# Patient Record
Sex: Female | Born: 1955 | Race: White | Hispanic: No | Marital: Married | Smoking: Never smoker
Health system: Southern US, Community
[De-identification: ages and names within clinical notes are randomized; demographics above are authoritative.]

## PROBLEM LIST (undated history)

## (undated) DIAGNOSIS — I10 Essential (primary) hypertension: Secondary | ICD-10-CM

## (undated) DIAGNOSIS — E785 Hyperlipidemia, unspecified: Secondary | ICD-10-CM

## (undated) DIAGNOSIS — Z889 Allergy status to unspecified drugs, medicaments and biological substances status: Secondary | ICD-10-CM

## (undated) DIAGNOSIS — K219 Gastro-esophageal reflux disease without esophagitis: Secondary | ICD-10-CM

## (undated) DIAGNOSIS — M1990 Unspecified osteoarthritis, unspecified site: Secondary | ICD-10-CM

## (undated) DIAGNOSIS — F419 Anxiety disorder, unspecified: Secondary | ICD-10-CM

## (undated) HISTORY — DX: Anxiety disorder, unspecified: F41.9

## (undated) HISTORY — DX: Allergy status to unspecified drugs, medicaments and biological substances: Z88.9

## (undated) HISTORY — DX: Hyperlipidemia, unspecified: E78.5

## (undated) HISTORY — DX: Unspecified osteoarthritis, unspecified site: M19.90

## (undated) HISTORY — DX: Gastro-esophageal reflux disease without esophagitis: K21.9

## (undated) HISTORY — PX: KNEE ARTHROSCOPY: SUR90

## (undated) HISTORY — DX: Essential (primary) hypertension: I10

---

## 2001-04-12 ENCOUNTER — Other Ambulatory Visit: Admission: RE | Admit: 2001-04-12 | Discharge: 2001-04-12 | Payer: Self-pay | Admitting: Dermatology

## 2002-03-03 ENCOUNTER — Encounter: Admission: RE | Admit: 2002-03-03 | Discharge: 2002-03-03 | Payer: Self-pay | Admitting: General Surgery

## 2002-03-03 ENCOUNTER — Encounter: Payer: Self-pay | Admitting: General Surgery

## 2002-05-01 ENCOUNTER — Emergency Department (HOSPITAL_COMMUNITY): Admission: EM | Admit: 2002-05-01 | Discharge: 2002-05-01 | Payer: Self-pay | Admitting: Emergency Medicine

## 2003-06-19 ENCOUNTER — Encounter: Admission: RE | Admit: 2003-06-19 | Discharge: 2003-06-19 | Payer: Self-pay | Admitting: Unknown Physician Specialty

## 2004-03-31 HISTORY — PX: CARPAL TUNNEL RELEASE: SHX101

## 2004-05-27 ENCOUNTER — Encounter: Admission: RE | Admit: 2004-05-27 | Discharge: 2004-07-18 | Payer: Self-pay | Admitting: *Deleted

## 2004-08-14 ENCOUNTER — Ambulatory Visit (HOSPITAL_BASED_OUTPATIENT_CLINIC_OR_DEPARTMENT_OTHER): Admission: RE | Admit: 2004-08-14 | Discharge: 2004-08-14 | Payer: Self-pay | Admitting: Orthopedic Surgery

## 2005-05-14 ENCOUNTER — Encounter: Admission: RE | Admit: 2005-05-14 | Discharge: 2005-05-14 | Payer: Self-pay | Admitting: *Deleted

## 2005-05-23 ENCOUNTER — Encounter: Admission: RE | Admit: 2005-05-23 | Discharge: 2005-05-23 | Payer: Self-pay | Admitting: *Deleted

## 2007-05-20 IMAGING — MG MM MAMMO SCREENING
4 series · 4 of 4 positions shown · non-contrast
Comparison: none

SCREENING MAMMOGRAM:
There is a fibroglandular pattern.  A possible mass is noted in the left breast.  Spot compression 
views and possibly sonography are recommended for further evaluation.  In the right breast, no 
masses or malignant type calcifications are identified.  Compared with prior studies.

[R CC]
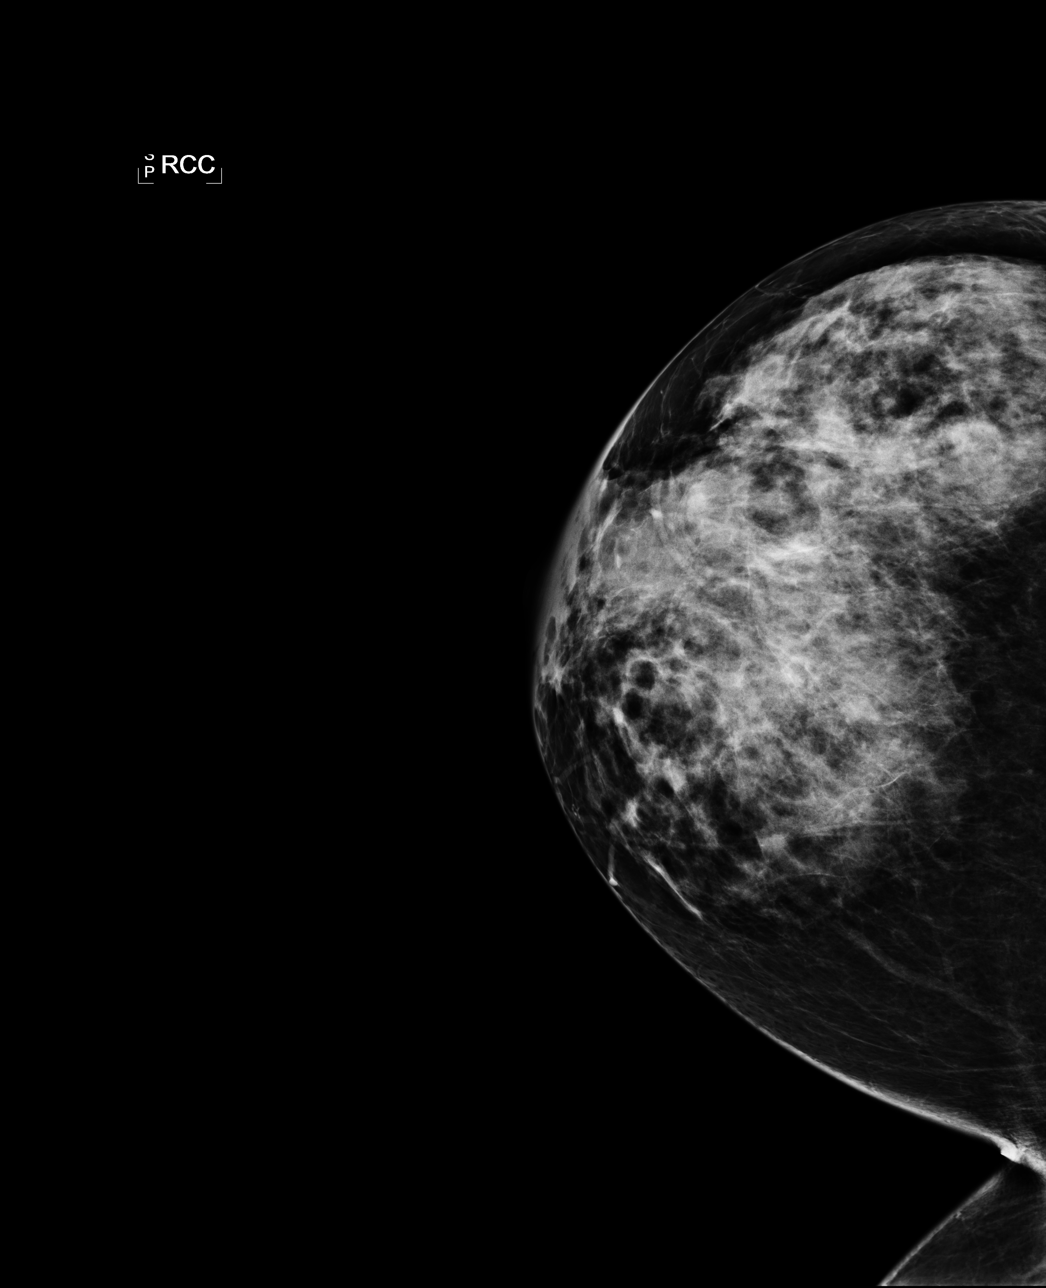

[L CC]
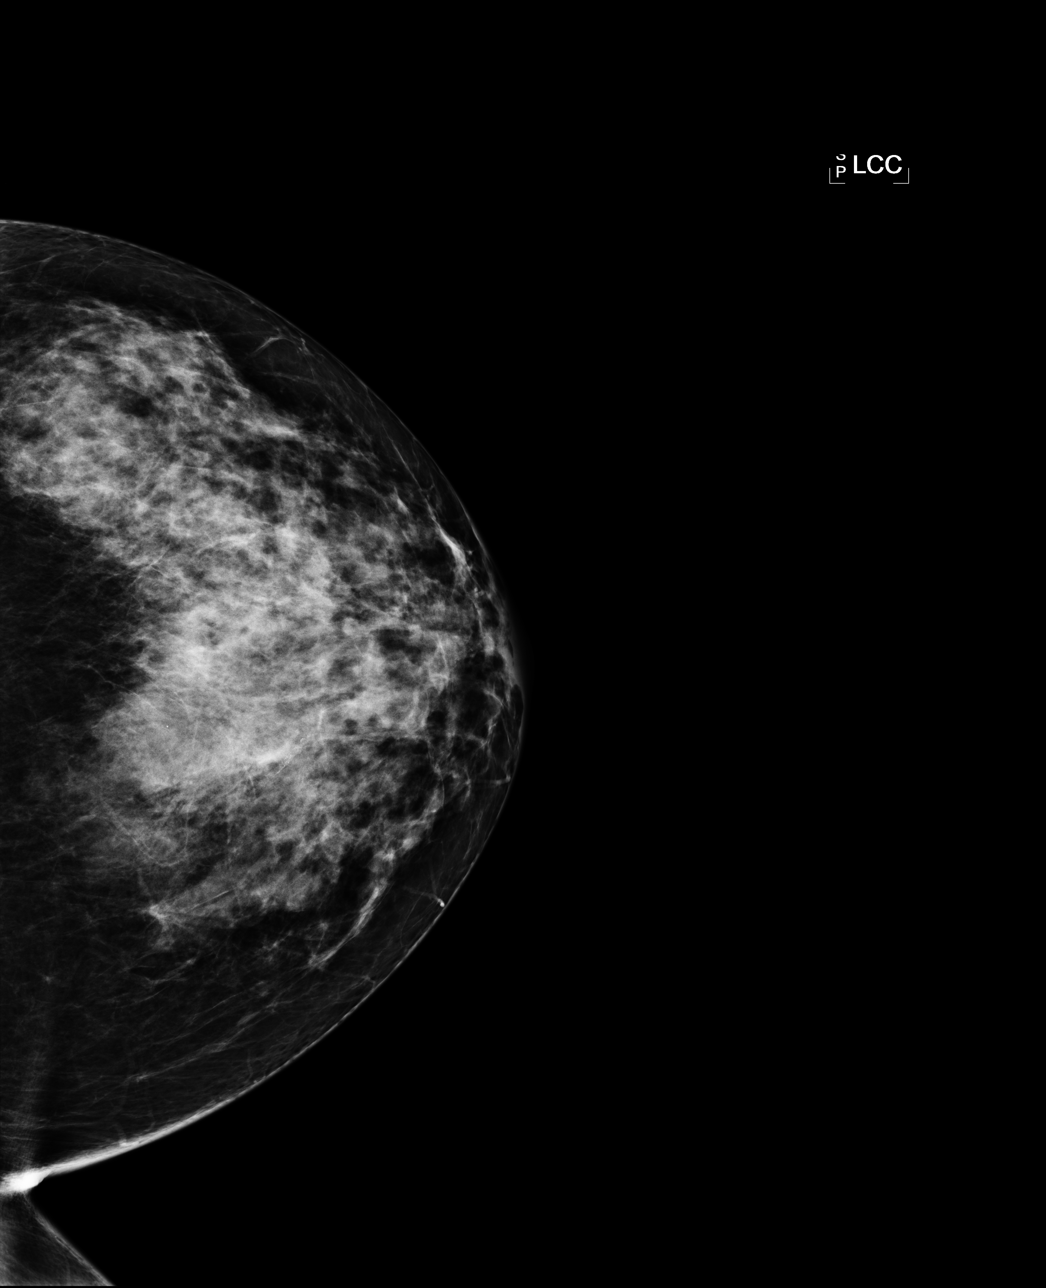

[L MLO]
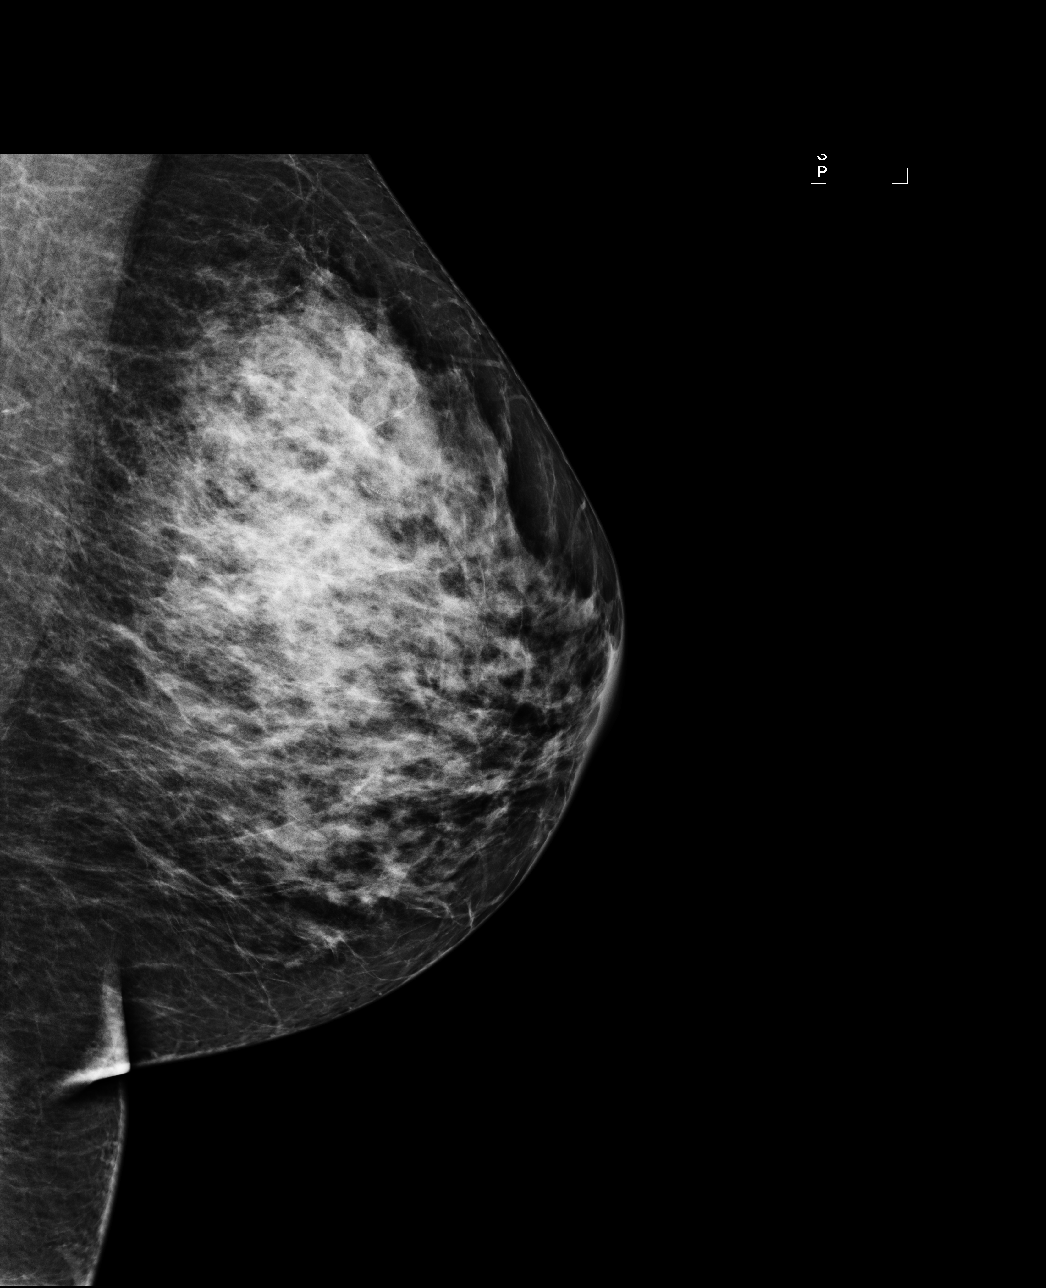

[R MLO]
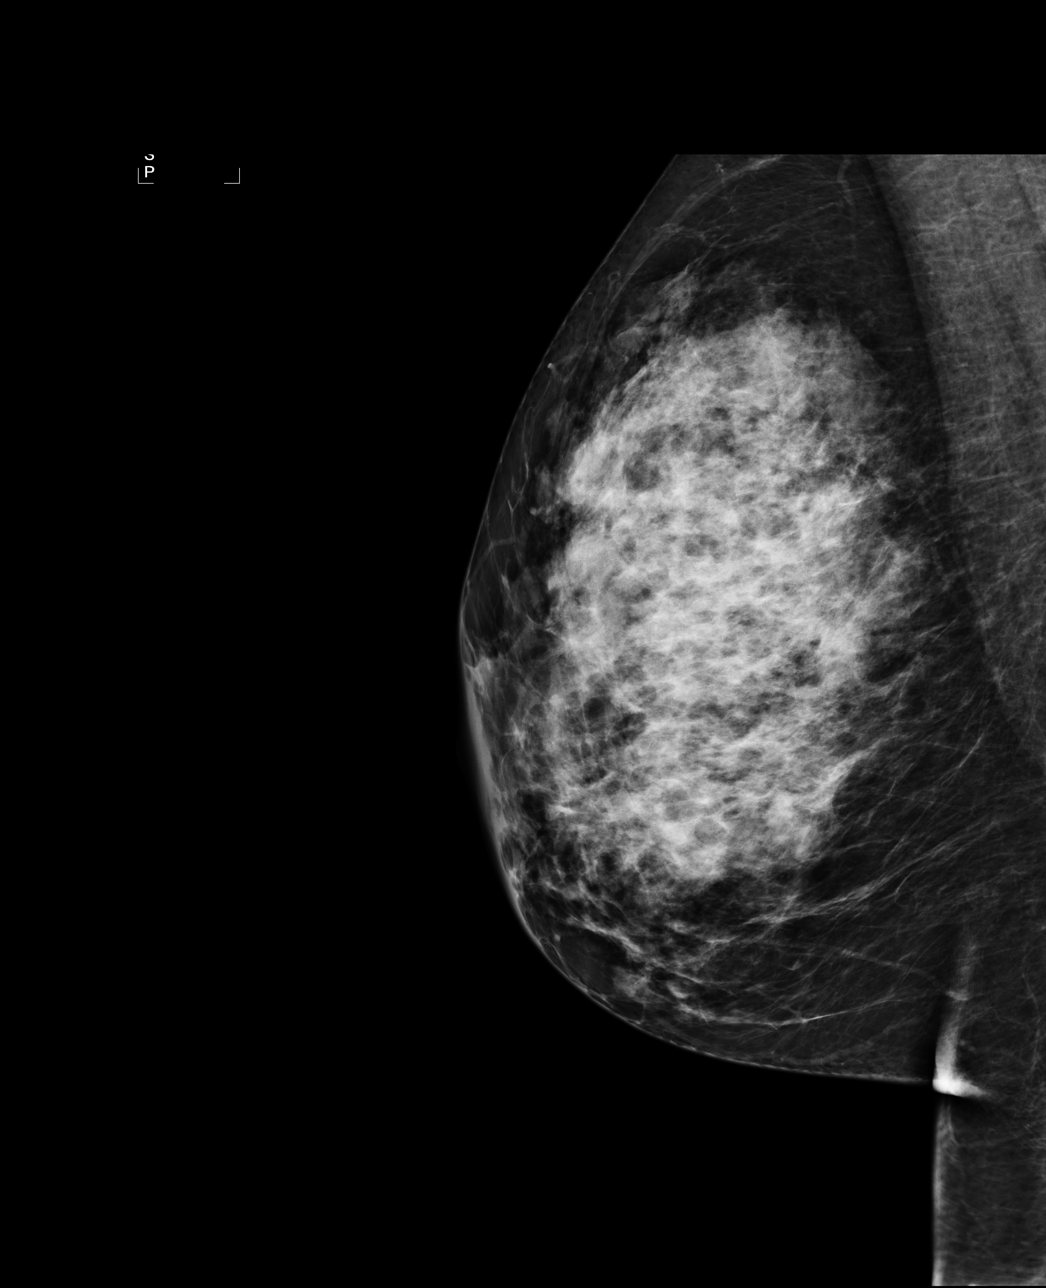

[4 of 4 positions shown; findings below may reference images not displayed]

IMPRESSION: Possible mass, left breast.  Additional evaluation is indicated.  The patient will be contacted for
additional studies and a supplementary report will follow.  No specific mammographic evidence of 
malignancy, right breast.

ASSESSMENT: Need additional imaging evaluation and/or prior mammograms for comparison - BI-RADS 0

Further imaging of the left breast.

## 2008-11-26 ENCOUNTER — Emergency Department (HOSPITAL_COMMUNITY): Admission: EM | Admit: 2008-11-26 | Discharge: 2008-11-26 | Payer: Self-pay | Admitting: Emergency Medicine

## 2008-12-07 ENCOUNTER — Ambulatory Visit (HOSPITAL_COMMUNITY): Admission: RE | Admit: 2008-12-07 | Discharge: 2008-12-07 | Payer: Self-pay | Admitting: Orthopedic Surgery

## 2008-12-20 ENCOUNTER — Encounter: Admission: RE | Admit: 2008-12-20 | Discharge: 2008-12-20 | Payer: Self-pay | Admitting: *Deleted

## 2008-12-21 ENCOUNTER — Encounter: Admission: RE | Admit: 2008-12-21 | Discharge: 2009-03-21 | Payer: Self-pay | Admitting: Orthopedic Surgery

## 2010-01-17 ENCOUNTER — Encounter: Admission: RE | Admit: 2010-01-17 | Discharge: 2010-01-17 | Payer: Self-pay | Admitting: *Deleted

## 2010-07-05 LAB — COMPREHENSIVE METABOLIC PANEL
Calcium: 9.7 mg/dL (ref 8.4–10.5)
GFR calc Af Amer: >60 mL/min (ref >60)
GFR calc non Af Amer: >60 mL/min (ref >60)
Glucose, Bld: 106 mg/dL — ABNORMAL HIGH (ref 70–99)
Potassium: 4.4 mEq/L (ref 3.5–5.1)
Sodium: 140 mEq/L (ref 135–145)
Total Bilirubin: 0.4 mg/dL (ref 0.3–1.2)
Total Protein: 7.5 g/dL (ref 6.0–8.3)

## 2010-07-05 LAB — CBC
HCT: 42 % (ref 36.0–46.0)
Hemoglobin: 14.4 g/dL (ref 12.0–15.0)
MCHC: 34.5 g/dL (ref 30.0–36.0)
MCV: 92 fL (ref 78.0–100.0)
Platelets: 179 10*3/uL (ref 150–400)
RDW: 12.5 % (ref 11.5–15.5)
WBC: 5.7 10*3/uL (ref 4.0–10.5)

## 2010-07-05 LAB — DIFFERENTIAL
Basophils Absolute: 0 10*3/uL (ref 0.0–0.1)
Basophils Relative: 0 % (ref 0–1)
Eosinophils Relative: 5 % (ref 0–5)
Monocytes Absolute: 0.4 10*3/uL (ref 0.1–1.0)
Neutro Abs: 4 10*3/uL (ref 1.7–7.7)
Neutrophils Relative %: 71 % (ref 43–77)

## 2010-08-16 NOTE — Op Note (Signed)
Kristen Arroyo, Kristen Arroyo             ACCOUNT NO.:  [id number]   MEDICAL RECORD NO.:  [id number]          PATIENT TYPE:  AMB   LOCATION:  DSC                          FACILITY:  [facility]   PHYSICIAN:  Cindee Salt, M.D.       DATE OF BIRTH:  October 23, 1955   DATE OF PROCEDURE:  08/14/2004  DATE OF DISCHARGE:                                 OPERATIVE REPORT   PREOPERATIVE DIAGNOSIS:  Carpal tunnel syndrome, right wrist; stenosing  tenosynovitis, right thumb.   POSTOPERATIVE DIAGNOSIS:  Carpal tunnel syndrome, right wrist; stenosing  tenosynovitis, right thumb.   OPERATION:  Release A1 pulley, right thumb and carpal tunnel release, right  hand.   SURGEON:  Cindee Salt, M.D.   ASSISTANTCarolyne Fiscal.   ANESTHESIA:  Forearm based IV regional.   HISTORY:  The patient is a 55 year old female with a history of carpal  tunnel syndrome EMG nerve conductions positive unresponsive to conservative  treatment. She also has triggering of her right thumb.   PROCEDURE:  The patient is brought to the operating room where a forearm  based IV regional anesthetic was carried out without difficulty. She was  prepped using DuraPrep, supine position, right arm free. A longitudinal  incision was made in the palm and carried down through subcutaneous tissue.  Bleeders were electrocauterized. Palmar fascia was split, superficial palmar  arch identified, the flexor tendon to the ring and little finger identified  to the ulnar side of median nerve, the carpal retinaculum was incised with  sharp dissection. A right angle and Sewell retractor were placed between  skin and forearm .  The forearm fascia was released for approximately a  centimeter and a half proximal to the wrist crease under direct vision.  Canal was explored. Tenosynovial tissue was moderately thickened. An area  compression to the nerve was apparent. The wound was irrigated. The skin was  closed with interrupted 5-0 nylon sutures. A transverse incision  was then  made over the metacarpophalangeal joint of the thumb, carried down through  subcutaneous tissue. Significant scarring about the A1 pulley with  thickening was identified. A significant nodule was present in the flexor  tendon.  A release was then performed to the A1 pulley on its radial aspect,  protecting the oblique pulley. The thumb placed through a full range motion,  no further triggering was identified, full mobility was present. The wound  was irrigated. The skin closed interrupted 5-0 nylon sutures. A sterile  compressive dressing and splint to the wrist and fingers applied. The  patient tolerated the procedure well and was taken to the recovery for  observation in satisfactory condition. She is discharged home to return to  the [facility] of [place] in one week on Vicodin.      GK/MEDQ  D:  08/14/2004  T:  08/14/2004  Job:  [id number]

## 2010-12-02 IMAGING — CR DG KNEE COMPLETE 4+V*L*
4 series · 4 of 4 positions shown · non-contrast
Comparison: None

CLINICAL DATA: Left knee injury with posterior pain.

LEFT KNEE - COMPLETE 4+ VIEW

[t knee ap left]
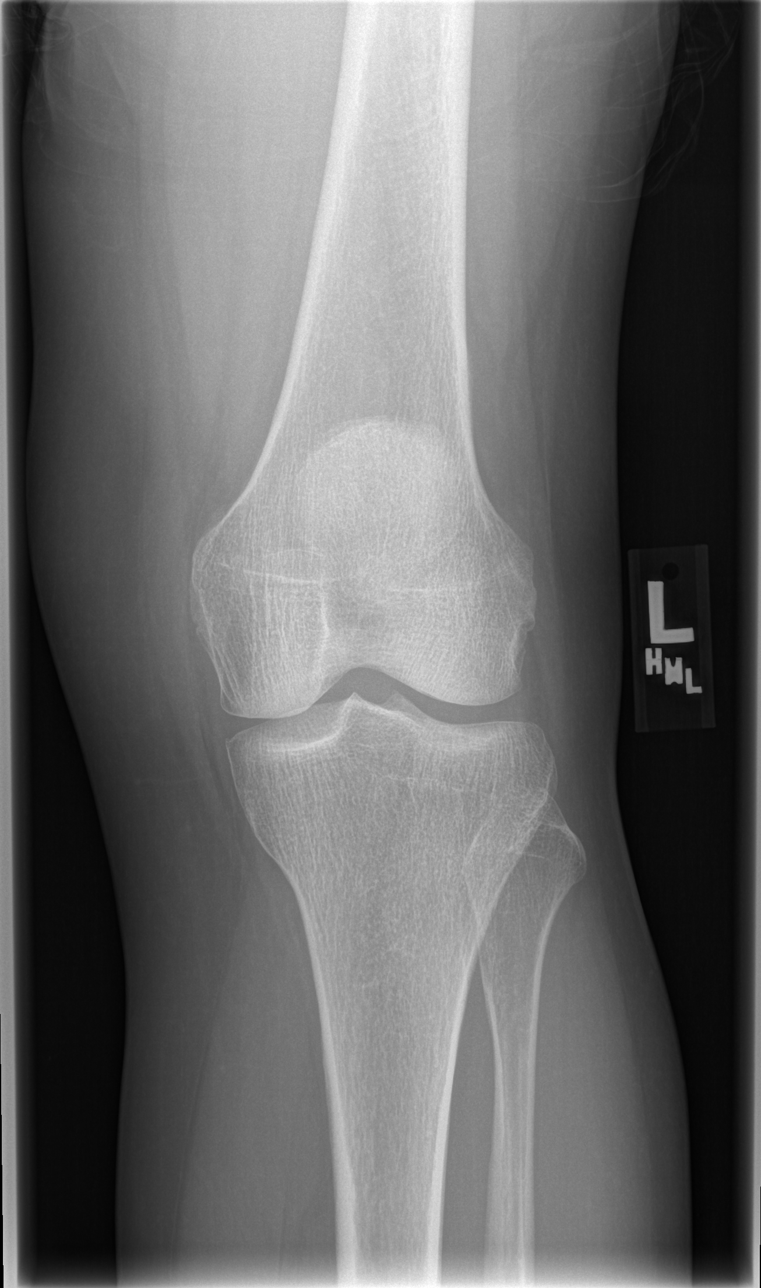

[t knee oblique left (1 of 2)]
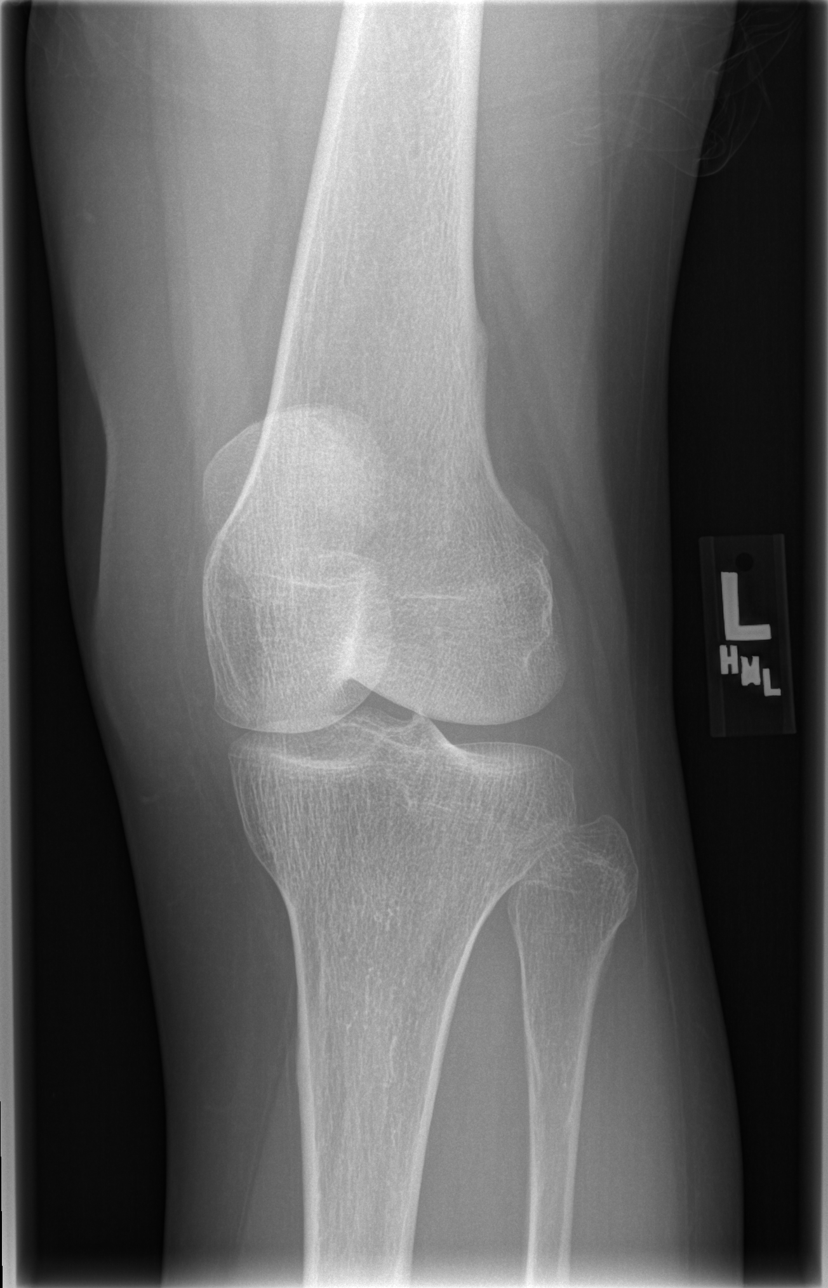

[t knee oblique left (2 of 2)]
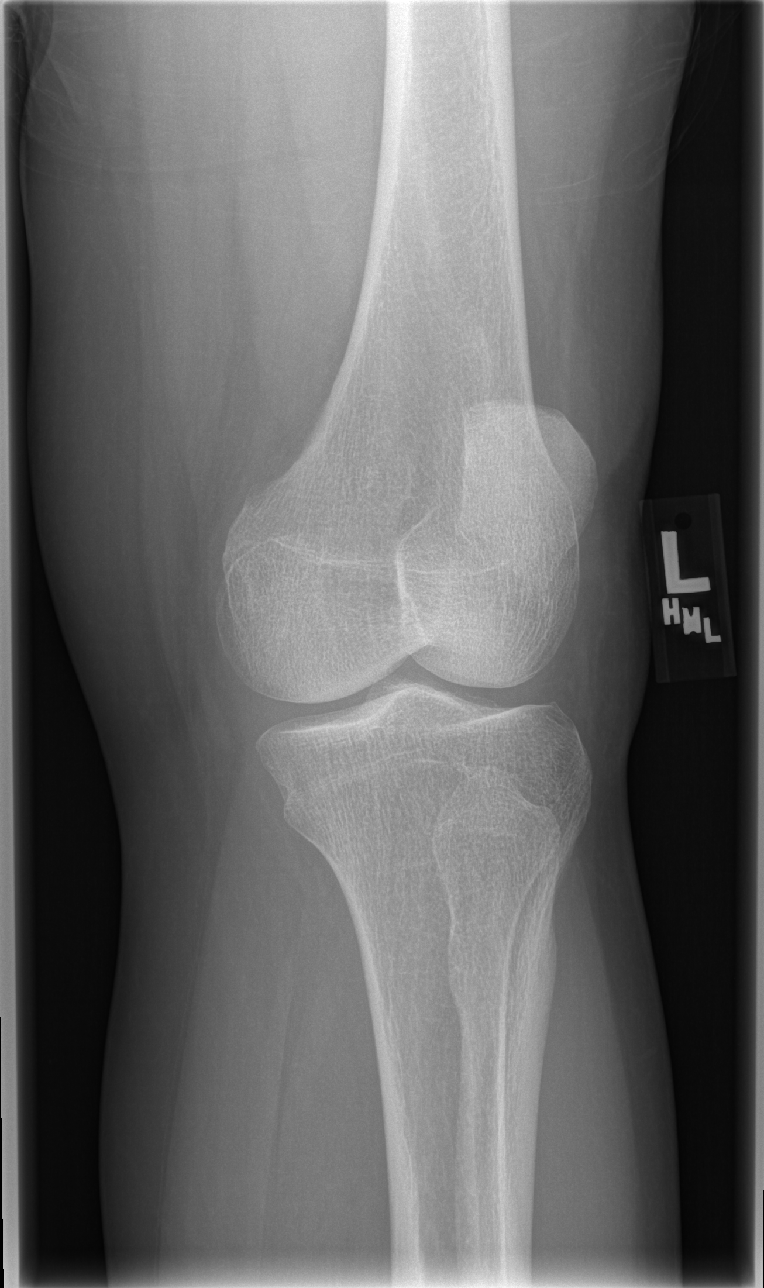

[t knee lat left]
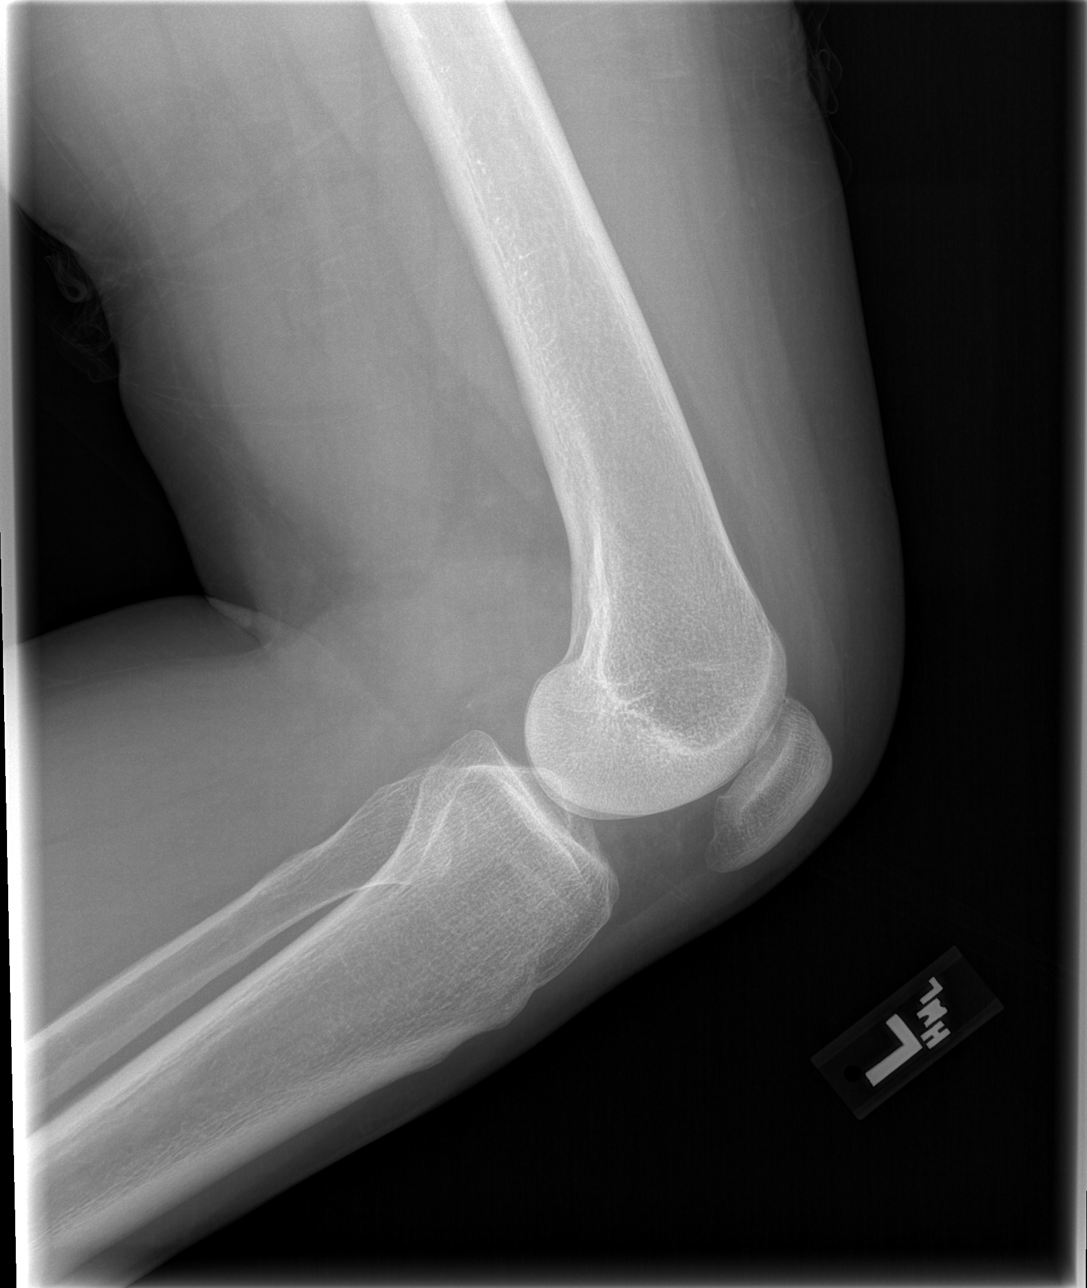

[4 of 4 positions shown; findings below may reference images not displayed]

FINDINGS: There is no evidence of fracture, dislocation, or joint
effusion.  There is no evidence of arthropathy or other focal bone
abnormality.  Soft tissues are unremarkable.
IMPRESSION: Negative.

## 2011-02-25 ENCOUNTER — Other Ambulatory Visit: Payer: Self-pay

## 2011-02-25 DIAGNOSIS — Z1231 Encounter for screening mammogram for malignant neoplasm of breast: Secondary | ICD-10-CM

## 2011-06-18 ENCOUNTER — Ambulatory Visit: Payer: Self-pay

## 2011-06-18 ENCOUNTER — Ambulatory Visit
Admission: RE | Admit: 2011-06-18 | Discharge: 2011-06-18 | Disposition: A | Discharge status: Home / Self Care | Payer: BC Managed Care – PPO | Source: Ambulatory Visit

## 2011-06-18 DIAGNOSIS — Z1231 Encounter for screening mammogram for malignant neoplasm of breast: Secondary | ICD-10-CM

## 2012-08-03 ENCOUNTER — Encounter: Payer: Self-pay | Admitting: Internal Medicine

## 2012-08-10 ENCOUNTER — Encounter: Payer: Self-pay | Admitting: Internal Medicine

## 2012-09-02 ENCOUNTER — Encounter: Payer: BC Managed Care – PPO | Admitting: Internal Medicine

## 2012-09-27 ENCOUNTER — Ambulatory Visit (AMBULATORY_SURGERY_CENTER): Payer: BC Managed Care – PPO | Admitting: *Deleted

## 2012-09-27 VITALS — Ht 66.0 in | Wt 167.2 lb

## 2012-09-27 DIAGNOSIS — Z1211 Encounter for screening for malignant neoplasm of colon: Secondary | ICD-10-CM

## 2012-09-27 MED ORDER — MOVIPREP 100 G PO SOLR: ORAL | Status: DC

## 2012-09-28 ENCOUNTER — Encounter: Payer: Self-pay | Admitting: Internal Medicine

## 2012-10-07 ENCOUNTER — Telehealth: Payer: Self-pay | Admitting: Internal Medicine

## 2012-10-07 NOTE — Telephone Encounter (Signed)
Pt notified okay to take Mobic day of procedure

## 2012-10-11 ENCOUNTER — Ambulatory Visit (AMBULATORY_SURGERY_CENTER): Payer: BC Managed Care – PPO | Admitting: Internal Medicine

## 2012-10-11 ENCOUNTER — Encounter: Payer: Self-pay | Admitting: Internal Medicine

## 2012-10-11 VITALS — BP 127/85 | HR 65 | Temp 96.9°F | Resp 11 | Ht 66.0 in | Wt 167.0 lb

## 2012-10-11 DIAGNOSIS — Z1211 Encounter for screening for malignant neoplasm of colon: Secondary | ICD-10-CM

## 2012-10-11 MED ORDER — SODIUM CHLORIDE 0.9 % IV SOLN: 500.0000 mL | INTRAVENOUS | Status: DC

## 2012-10-11 NOTE — Progress Notes (Signed)
Patient did not experience any of the following events: a burn prior to discharge; a fall within the facility; wrong site/side/patient/procedure/implant event; or a hospital transfer or hospital admission upon discharge from the facility. (G8907) Patient did not have preoperative order for IV antibiotic SSI prophylaxis. (G8918)  

## 2012-10-11 NOTE — Patient Instructions (Addendum)
YOU HAD AN ENDOSCOPIC PROCEDURE TODAY AT THE [facility]: Refer to the procedure report that was given to you for any specific questions about what was found during the examination.  If the procedure report does not answer your questions, please call your gastroenterologist to clarify.  If you requested that your care partner not be given the details of your procedure findings, then the procedure report has been included in a sealed envelope for you to review at your convenience later.  YOU SHOULD EXPECT: Some feelings of bloating in the abdomen. Passage of more gas than usual.  Walking can help get rid of the air that was put into your GI tract during the procedure and reduce the bloating. If you had a lower endoscopy (such as a colonoscopy or flexible sigmoidoscopy) you may notice spotting of blood in your stool or on the toilet paper. If you underwent a bowel prep for your procedure, then you may not have a normal bowel movement for a few days.  DIET: Your first meal following the procedure should be a light meal and then it is ok to progress to your normal diet.  A half-sandwich or bowl of soup is an example of a good first meal.  Heavy or fried foods are harder to digest and may make you feel nauseous or bloated.  Likewise meals heavy in dairy and vegetables can cause extra gas to form and this can also increase the bloating.  Drink plenty of fluids but you should avoid alcoholic beverages for 24 hours.  ACTIVITY: Your care partner should take you home directly after the procedure.  You should plan to take it easy, moving slowly for the rest of the day.  You can resume normal activity the day after the procedure however you should NOT DRIVE or use heavy machinery for 24 hours (because of the sedation medicines used during the test).    SYMPTOMS TO REPORT IMMEDIATELY: A gastroenterologist can be reached at any hour.  During normal business hours, 8:30 AM to 5:00 PM Monday through Friday,  call [phone].  After hours and on weekends, please call the GI answering service at [phone] who will take a message and have the physician on call contact you.   Following lower endoscopy (colonoscopy or flexible sigmoidoscopy):  Excessive amounts of blood in the stool  Significant tenderness or worsening of abdominal pains  Swelling of the abdomen that is new, acute  Fever of 100F or higher   FOLLOW UP:  Our staff will call the home number listed on your records the next business day following your procedure to check on you and address any questions or concerns that you may have at that time regarding the information given to you following your procedure. This is a courtesy call and so if there is no answer at the home number and we have not heard from you through the emergency physician on call, we will assume that you have returned to your regular daily activities without incident.  SIGNATURES/CONFIDENTIALITY: You and/or your care partner have signed paperwork which will be entered into your electronic medical record.  These signatures attest to the fact that that the information above on your After Visit Summary has been reviewed and is understood.  Full responsibility of the confidentiality of this discharge information lies with you and/or your care-partner.  Ok to continue your normal medications  Follow up colonoscopy in 10 years  Please read over handout about diverticulosis and high fiber diets

## 2012-10-11 NOTE — Progress Notes (Signed)
CRNA and doctor made aware of pt. B/P 173/102 left arm and 173/107 rt. Arm. Pt. Stated she did not take blood pressure medication. No new orders received.

## 2012-10-11 NOTE — Op Note (Signed)
[facility] [address] [place], [address]   COLONOSCOPY PROCEDURE REPORT  PATIENT: Kristen Arroyo, Kristen Arroyo  MR#: [id number] BIRTHDATE: 02-01-1956 , 57  yrs. old GENDER: Female ENDOSCOPIST: Roxy Cedar, MD REFERRED AO:ZHYQM Yetta Barre, PA-C PROCEDURE DATE:  10/11/2012 PROCEDURE:   Colonoscopy, screening First Screening Colonoscopy - Avg.  risk and is 50 yrs.  old or older Yes.  Prior Negative Screening - Now for repeat screening. N/A  History of Adenoma - Now for follow-up colonoscopy & has been > or = to 3 yrs.  N/A  Polyps Removed Today? No.  Recommend repeat exam, <10 yrs? No. ASA CLASS:   Class II INDICATIONS:average risk screening. MEDICATIONS: MAC sedation, administered by CRNA and propofol (Diprivan) 350mg  IV  DESCRIPTION OF PROCEDURE:   After the risks benefits and alternatives of the procedure were thoroughly explained, informed consent was obtained.  A digital rectal exam revealed no abnormalities of the rectum.   The LB [id number]  endoscope was introduced through the anus and advanced to the cecum, which was identified by both the appendix and ileocecal valve. No adverse events experienced.   The quality of the prep was excellent, using MoviPrep  The instrument was then slowly withdrawn as the colon was fully examined.     COLON FINDINGS: Mild diverticulosis was noted in the sigmoid colon. The colon was otherwise normal.  There was no  inflammation, polyps or cancers .  Retroflexed views revealed internal hemorrhoids. The time to cecum=2 minutes 40 seconds.  Withdrawal time=9 minutes 50 seconds.  The scope was withdrawn and the procedure completed.  COMPLICATIONS: There were no complications.  ENDOSCOPIC IMPRESSION: 1.   Mild diverticulosis was noted in the sigmoid colon 2.   The colon was otherwise normal  RECOMMENDATIONS: 1. Continue current colorectal screening recommendations for "routine risk" patients with a repeat colonoscopy  in 10 years.   eSigned:  Roxy Cedar, MD 10/11/2012 10:56 AM   cc: Prudy Feeler, [facility]   PATIENT NAME:  [name], [name] MR#: [id number]

## 2012-10-12 ENCOUNTER — Telehealth: Payer: Self-pay | Admitting: *Deleted

## 2012-10-12 NOTE — Telephone Encounter (Signed)
No identifier, left message, follow-up  

## 2012-12-15 ENCOUNTER — Encounter (INDEPENDENT_AMBULATORY_CARE_PROVIDER_SITE_OTHER): Payer: BC Managed Care – PPO | Admitting: Ophthalmology

## 2012-12-15 DIAGNOSIS — H35039 Hypertensive retinopathy, unspecified eye: Secondary | ICD-10-CM

## 2012-12-15 DIAGNOSIS — I10 Essential (primary) hypertension: Secondary | ICD-10-CM

## 2012-12-15 DIAGNOSIS — H2510 Age-related nuclear cataract, unspecified eye: Secondary | ICD-10-CM

## 2012-12-15 DIAGNOSIS — H43819 Vitreous degeneration, unspecified eye: Secondary | ICD-10-CM

## 2013-11-01 ENCOUNTER — Other Ambulatory Visit: Payer: Self-pay

## 2013-11-01 DIAGNOSIS — Z1231 Encounter for screening mammogram for malignant neoplasm of breast: Secondary | ICD-10-CM

## 2013-11-07 ENCOUNTER — Ambulatory Visit
Admission: RE | Admit: 2013-11-07 | Discharge: 2013-11-07 | Disposition: A | Discharge status: Home / Self Care | Payer: BC Managed Care – PPO | Source: Ambulatory Visit

## 2013-11-07 DIAGNOSIS — Z1231 Encounter for screening mammogram for malignant neoplasm of breast: Secondary | ICD-10-CM

## 2015-02-27 ENCOUNTER — Other Ambulatory Visit: Payer: Self-pay

## 2015-02-27 DIAGNOSIS — Z1231 Encounter for screening mammogram for malignant neoplasm of breast: Secondary | ICD-10-CM

## 2015-03-19 ENCOUNTER — Ambulatory Visit
Admission: RE | Admit: 2015-03-19 | Discharge: 2015-03-19 | Disposition: A | Discharge status: Home / Self Care | Payer: BLUE CROSS/BLUE SHIELD | Source: Ambulatory Visit

## 2015-03-19 DIAGNOSIS — Z1231 Encounter for screening mammogram for malignant neoplasm of breast: Secondary | ICD-10-CM

## 2016-01-10 ENCOUNTER — Other Ambulatory Visit: Payer: Self-pay

## 2016-01-10 MED ORDER — LISINOPRIL 5 MG PO TABS: 5.0000 mg | ORAL_TABLET | Freq: Every day | ORAL | 0 refills | Status: DC

## 2016-01-10 MED ORDER — HYDROCHLOROTHIAZIDE 25 MG PO TABS: 25.0000 mg | ORAL_TABLET | Freq: Every day | ORAL | 0 refills | Status: DC

## 2016-01-14 ENCOUNTER — Other Ambulatory Visit: Payer: Self-pay

## 2016-01-14 MED ORDER — LORATADINE 10 MG PO TABS: 10.0000 mg | ORAL_TABLET | Freq: Every day | ORAL | 0 refills | Status: DC

## 2016-02-27 ENCOUNTER — Other Ambulatory Visit: Payer: Self-pay | Admitting: Physician Assistant

## 2016-02-27 DIAGNOSIS — Z1231 Encounter for screening mammogram for malignant neoplasm of breast: Secondary | ICD-10-CM

## 2016-03-04 ENCOUNTER — Ambulatory Visit (INDEPENDENT_AMBULATORY_CARE_PROVIDER_SITE_OTHER): Payer: BLUE CROSS/BLUE SHIELD | Admitting: Physician Assistant

## 2016-03-04 ENCOUNTER — Encounter: Payer: Self-pay | Admitting: Physician Assistant

## 2016-03-04 VITALS — BP 129/80 | HR 73 | Temp 97.2°F | Ht 63.0 in | Wt 164.2 lb

## 2016-03-04 DIAGNOSIS — J0100 Acute maxillary sinusitis, unspecified: Secondary | ICD-10-CM

## 2016-03-04 DIAGNOSIS — I10 Essential (primary) hypertension: Secondary | ICD-10-CM

## 2016-03-04 DIAGNOSIS — M25549 Pain in joints of unspecified hand: Secondary | ICD-10-CM

## 2016-03-04 DIAGNOSIS — F3342 Major depressive disorder, recurrent, in full remission: Secondary | ICD-10-CM

## 2016-03-04 DIAGNOSIS — Z01419 Encounter for gynecological examination (general) (routine) without abnormal findings: Secondary | ICD-10-CM | POA: Diagnosis not present

## 2016-03-04 DIAGNOSIS — E782 Mixed hyperlipidemia: Secondary | ICD-10-CM

## 2016-03-04 DIAGNOSIS — Z0000 Encounter for general adult medical examination without abnormal findings: Secondary | ICD-10-CM

## 2016-03-04 DIAGNOSIS — K219 Gastro-esophageal reflux disease without esophagitis: Secondary | ICD-10-CM

## 2016-03-04 DIAGNOSIS — J3089 Other allergic rhinitis: Secondary | ICD-10-CM

## 2016-03-04 MED ORDER — LORATADINE 10 MG PO TABS: 10.0000 mg | ORAL_TABLET | Freq: Every day | ORAL | 0 refills | Status: DC

## 2016-03-04 MED ORDER — DICLOFENAC SODIUM 75 MG PO TBEC: 75.0000 mg | DELAYED_RELEASE_TABLET | Freq: Two times a day (BID) | ORAL | 3 refills | Status: DC

## 2016-03-04 MED ORDER — AMOXICILLIN 500 MG PO CAPS: 1000.0000 mg | ORAL_CAPSULE | Freq: Two times a day (BID) | ORAL | 1 refills | Status: DC

## 2016-03-04 MED ORDER — HYDROCHLOROTHIAZIDE 25 MG PO TABS: 25.0000 mg | ORAL_TABLET | Freq: Every day | ORAL | 3 refills | Status: DC

## 2016-03-04 MED ORDER — BUDESONIDE 32 MCG/ACT NA SUSP: 1.0000 | Freq: Two times a day (BID) | NASAL | 11 refills | Status: DC

## 2016-03-04 MED ORDER — ALPRAZOLAM 0.5 MG PO TABS: 0.5000 mg | ORAL_TABLET | Freq: Every evening | ORAL | 5 refills | Status: DC | PRN

## 2016-03-04 MED ORDER — LISINOPRIL 5 MG PO TABS: 5.0000 mg | ORAL_TABLET | Freq: Every day | ORAL | 0 refills | Status: DC

## 2016-03-04 MED ORDER — OMEPRAZOLE 20 MG PO CPDR: 20.0000 mg | DELAYED_RELEASE_CAPSULE | Freq: Every day | ORAL | 3 refills | Status: DC

## 2016-03-04 MED ORDER — SIMVASTATIN 40 MG PO TABS: 40.0000 mg | ORAL_TABLET | Freq: Every evening | ORAL | 3 refills | Status: DC

## 2016-03-04 MED ORDER — BUPROPION HCL ER (SR) 150 MG PO TB12: 150.0000 mg | ORAL_TABLET | Freq: Every day | ORAL | 3 refills | Status: DC

## 2016-03-04 NOTE — Patient Instructions (Signed)
DASH Eating Plan DASH stands for "Dietary Approaches to Stop Hypertension." The DASH eating plan is a healthy eating plan that has been shown to reduce high blood pressure (hypertension). Additional health benefits may include reducing the risk of type 2 diabetes mellitus, heart disease, and stroke. The DASH eating plan may also help with weight loss. What do I need to know about the DASH eating plan? For the DASH eating plan, you will follow these general guidelines:  Choose foods with less than 150 milligrams of sodium per serving (as listed on the food label).  Use salt-free seasonings or herbs instead of table salt or sea salt.  Check with your health care provider or pharmacist before using salt substitutes.  Eat lower-sodium products. These are often labeled as "low-sodium" or "no salt added."  Eat fresh foods. Avoid eating a lot of canned foods.  Eat more vegetables, fruits, and low-fat dairy products.  Choose whole grains. Look for the word "whole" as the first word in the ingredient list.  Choose fish and skinless chicken or turkey more often than red meat. Limit fish, poultry, and meat to 6 oz (170 g) each day.  Limit sweets, desserts, sugars, and sugary drinks.  Choose heart-healthy fats.  Eat more home-cooked food and less restaurant, buffet, and fast food.  Limit fried foods.  Do not fry foods. Cook foods using methods such as baking, boiling, grilling, and broiling instead.  When eating at a restaurant, ask that your food be prepared with less salt, or no salt if possible. What foods can I eat? Seek help from a dietitian for individual calorie needs. Grains  Whole grain or whole wheat bread. Brown rice. Whole grain or whole wheat pasta. Quinoa, bulgur, and whole grain cereals. Low-sodium cereals. Corn or whole wheat flour tortillas. Whole grain cornbread. Whole grain crackers. Low-sodium crackers. Vegetables  Fresh or frozen vegetables (raw, steamed, roasted, or  grilled). Low-sodium or reduced-sodium tomato and vegetable juices. Low-sodium or reduced-sodium tomato sauce and paste. Low-sodium or reduced-sodium canned vegetables. Fruits  All fresh, canned (in natural juice), or frozen fruits. Meat and Other Protein Products  Ground beef (85% or leaner), grass-fed beef, or beef trimmed of fat. Skinless chicken or turkey. Ground chicken or turkey. Pork trimmed of fat. All fish and seafood. Eggs. Dried beans, peas, or lentils. Unsalted nuts and seeds. Unsalted canned beans. Dairy  Low-fat dairy products, such as skim or 1% milk, 2% or reduced-fat cheeses, low-fat ricotta or cottage cheese, or plain low-fat yogurt. Low-sodium or reduced-sodium cheeses. Fats and Oils  Tub margarines without trans fats. Light or reduced-fat mayonnaise and salad dressings (reduced sodium). Avocado. Safflower, olive, or canola oils. Natural peanut or almond butter. Other  Unsalted popcorn and pretzels. The items listed above may not be a complete list of recommended foods or beverages. Contact your dietitian for more options.  What foods are not recommended? Grains  White bread. White pasta. White rice. Refined cornbread. Bagels and croissants. Crackers that contain trans fat. Vegetables  Creamed or fried vegetables. Vegetables in a cheese sauce. Regular canned vegetables. Regular canned tomato sauce and paste. Regular tomato and vegetable juices. Fruits  Canned fruit in light or heavy syrup. Fruit juice. Meat and Other Protein Products  Fatty cuts of meat. Ribs, chicken wings, bacon, sausage, bologna, salami, chitterlings, fatback, hot dogs, bratwurst, and packaged luncheon meats. Salted nuts and seeds. Canned beans with salt. Dairy  Whole or 2% milk, cream, half-and-half, and cream cheese. Whole-fat or sweetened yogurt. Full-fat cheeses   or blue cheese. Nondairy creamers and whipped toppings. Processed cheese, cheese spreads, or cheese curds. Condiments  Onion and garlic  salt, seasoned salt, table salt, and sea salt. Canned and packaged gravies. Worcestershire sauce. Tartar sauce. Barbecue sauce. Teriyaki sauce. Soy sauce, including reduced sodium. Steak sauce. Fish sauce. Oyster sauce. Cocktail sauce. Horseradish. Ketchup and mustard. Meat flavorings and tenderizers. Bouillon cubes. Hot sauce. Tabasco sauce. Marinades. Taco seasonings. Relishes. Fats and Oils  Butter, stick margarine, lard, shortening, ghee, and bacon fat. Coconut, palm kernel, or palm oils. Regular salad dressings. Other  Pickles and olives. Salted popcorn and pretzels. The items listed above may not be a complete list of foods and beverages to avoid. Contact your dietitian for more information.  Where can I find more information? National Heart, Lung, and Blood Institute: www.nhlbi.nih.gov/health/health-topics/topics/dash/ This information is not intended to replace advice given to you by your health care provider. Make sure you discuss any questions you have with your health care provider. Document Released: 03/06/2011 Document Revised: 08/23/2015 Document Reviewed: 01/19/2013 Elsevier Interactive Patient Education  2017 [employer]  

## 2016-03-05 LAB — CMP14+EGFR
ALBUMIN: 4.7 g/dL (ref 3.5–5.5)
ALK PHOS: 84 IU/L (ref 39–117)
ALT: 19 IU/L (ref 0–32)
AST: 18 IU/L (ref 0–40)
Albumin/Globulin Ratio: 1.7 (ref 1.2–2.2)
BILIRUBIN TOTAL: 0.4 mg/dL (ref 0.0–1.2)
BUN / CREAT RATIO: 27 — AB (ref 9–23)
BUN: 17 mg/dL (ref 6–24)
CHLORIDE: 99 mmol/L (ref 96–106)
CO2: 28 mmol/L (ref 18–29)
CREATININE: 0.63 mg/dL (ref 0.57–1.00)
Calcium: 9.5 mg/dL (ref 8.7–10.2)
GFR calc Af Amer: 114 mL/min/{1.73_m2} (ref >59)
GFR calc non Af Amer: 98 mL/min/{1.73_m2} (ref >59)
GLOBULIN, TOTAL: 2.7 g/dL (ref 1.5–4.5)
GLUCOSE: 76 mg/dL (ref 65–99)
Potassium: 4.3 mmol/L (ref 3.5–5.2)
SODIUM: 142 mmol/L (ref 134–144)
Total Protein: 7.4 g/dL (ref 6.0–8.5)

## 2016-03-05 LAB — CBC WITH DIFFERENTIAL/PLATELET
BASOS ABS: 0 10*3/uL (ref 0.0–0.2)
Basos: 0 %
EOS (ABSOLUTE): 0.2 10*3/uL (ref 0.0–0.4)
Eos: 4 %
Hematocrit: 41.9 % (ref 34.0–46.6)
Hemoglobin: 14.4 g/dL (ref 11.1–15.9)
Immature Grans (Abs): 0 10*3/uL (ref 0.0–0.1)
Immature Granulocytes: 0 %
LYMPHS ABS: 1.3 10*3/uL (ref 0.7–3.1)
Lymphs: 23 %
MCH: 31.6 pg (ref 26.6–33.0)
MCHC: 34.4 g/dL (ref 31.5–35.7)
MCV: 92 fL (ref 79–97)
MONOS ABS: 0.5 10*3/uL (ref 0.1–0.9)
Monocytes: 8 %
NEUTROS ABS: 3.6 10*3/uL (ref 1.4–7.0)
Neutrophils: 65 %
Platelets: 238 10*3/uL (ref 150–379)
RBC: 4.56 x10E6/uL (ref 3.77–5.28)
RDW: 12.9 % (ref 12.3–15.4)
WBC: 5.5 10*3/uL (ref 3.4–10.8)

## 2016-03-05 LAB — LIPID PANEL
CHOLESTEROL TOTAL: 203 mg/dL — AB (ref 100–199)
Chol/HDL Ratio: 4.1 ratio units (ref 0.0–4.4)
HDL: 50 mg/dL (ref >39)
LDL CALC: 122 mg/dL — AB (ref 0–99)
TRIGLYCERIDES: 154 mg/dL — AB (ref 0–149)
VLDL Cholesterol Cal: 31 mg/dL (ref 5–40)

## 2016-03-06 DIAGNOSIS — F3342 Major depressive disorder, recurrent, in full remission: Secondary | ICD-10-CM | POA: Insufficient documentation

## 2016-03-06 DIAGNOSIS — Z01419 Encounter for gynecological examination (general) (routine) without abnormal findings: Secondary | ICD-10-CM | POA: Insufficient documentation

## 2016-03-06 DIAGNOSIS — M25549 Pain in joints of unspecified hand: Secondary | ICD-10-CM | POA: Insufficient documentation

## 2016-03-06 DIAGNOSIS — J0100 Acute maxillary sinusitis, unspecified: Secondary | ICD-10-CM | POA: Insufficient documentation

## 2016-03-06 DIAGNOSIS — J3089 Other allergic rhinitis: Secondary | ICD-10-CM | POA: Insufficient documentation

## 2016-03-06 DIAGNOSIS — K219 Gastro-esophageal reflux disease without esophagitis: Secondary | ICD-10-CM | POA: Insufficient documentation

## 2016-03-06 DIAGNOSIS — E782 Mixed hyperlipidemia: Secondary | ICD-10-CM | POA: Insufficient documentation

## 2016-03-06 DIAGNOSIS — I10 Essential (primary) hypertension: Secondary | ICD-10-CM | POA: Insufficient documentation

## 2016-03-06 NOTE — Progress Notes (Signed)
BP 129/80   Pulse 73   Temp 97.2 F (36.2 C) (Oral)   Ht '5\' 3"'  (1.6 m)   Wt 164 lb 3.2 oz (74.5 kg)   BMI 29.09 kg/m    Subjective:    Patient ID: Kristen Arroyo, female    DOB: January 11, 1956, 60 y.o.   MRN: [id number]  Kristen Arroyo is a 60 y.o. female presenting on 03/04/2016 for Annual Exam  HPI Patient here to be established as new patient at [place].  This patient is known to me from [facility]. This patient comes in for Annual female exam and recheck on medications and conditions. All medications are reviewed today. There are no reports of any problems with the medications. All of the medical conditions are reviewed and updated.  Lab work is reviewed and will be ordered as medically necessary. There are no new problems reported with today's visit.   Past Medical History:  Diagnosis Date  . Anxiety   . Arthritis    knees  . GERD (gastroesophageal reflux disease)   . History of seasonal allergies   . Hyperlipidemia   . Hypertension    Relevant past medical, surgical, family and social history reviewed and updated as indicated. Interim medical history since our last visit reviewed. Allergies and medications reviewed and updated.   Data reviewed from any sources in EPIC.  Review of Systems  Constitutional: Negative.  Negative for activity change, fatigue and fever.  HENT: Negative.   Eyes: Negative.   Respiratory: Negative.  Negative for cough.   Cardiovascular: Negative.  Negative for chest pain.  Gastrointestinal: Negative.  Negative for abdominal pain.  Endocrine: Negative.   Genitourinary: Negative.  Negative for dysuria.  Musculoskeletal: Positive for arthralgias, joint swelling and myalgias.  Skin: Negative.   Neurological: Negative.      Social History   Social History  . Marital status: Married    Spouse name: N/A  . Number of children: N/A  . Years of education: N/A   Occupational History  . Not on file.     Social History Main Topics  . Smoking status: Never Smoker  . Smokeless tobacco: Never Used  . Alcohol use No  . Drug use: No  . Sexual activity: Not on file   Other Topics Concern  . Not on file   Social History Narrative  . No narrative on file    Past Surgical History:  Procedure Laterality Date  . CARPAL TUNNEL RELEASE  2006   right  . KNEE ARTHROSCOPY     left: torn meniscus    Family History  Problem Relation Age of Onset  . Colon cancer Neg Hx       Medication List       Accurate as of 03/04/16 11:59 PM. Always use your most recent med list.          ALPRAZolam 0.5 MG tablet Commonly known as:  XANAX Take 1 tablet (0.5 mg total) by mouth at bedtime as needed for sleep.   amoxicillin 500 MG capsule Commonly known as:  AMOXIL Take 2 capsules (1,000 mg total) by mouth 2 (two) times daily.   BENADRYL ALLERGY PO Take by mouth as needed.   budesonide 32 MCG/ACT nasal spray Commonly known as:  RHINOCORT AQUA Place 1 spray into both nostrils 2 (two) times daily.   buPROPion 150 MG 12 hr tablet Commonly known as:  WELLBUTRIN SR Take 1 tablet (150 mg total) by mouth daily.  diclofenac 75 MG EC tablet Commonly known as:  VOLTAREN Take 1 tablet (75 mg total) by mouth 2 (two) times daily.   hydrochlorothiazide 25 MG tablet Commonly known as:  HYDRODIURIL Take 1 tablet (25 mg total) by mouth daily.   lisinopril 5 MG tablet Commonly known as:  PRINIVIL,ZESTRIL Take 1 tablet (5 mg total) by mouth daily.   loratadine 10 MG tablet Commonly known as:  CLARITIN Take 1 tablet (10 mg total) by mouth daily.   multivitamin tablet Take 1 tablet by mouth daily.   omeprazole 20 MG capsule Commonly known as:  PRILOSEC Take 1 capsule (20 mg total) by mouth daily.   simvastatin 40 MG tablet Commonly known as:  ZOCOR Take 1 tablet (40 mg total) by mouth every evening.          Objective:    BP 129/80   Pulse 73   Temp 97.2 F (36.2 C) (Oral)    Ht '5\' 3"'  (1.6 m)   Wt 164 lb 3.2 oz (74.5 kg)   BMI 29.09 kg/m   No Known Allergies Wt Readings from Last 3 Encounters:  03/04/16 164 lb 3.2 oz (74.5 kg)  10/11/12 167 lb (75.8 kg)  09/27/12 167 lb 3.2 oz (75.8 kg)    Physical Exam  Constitutional: She is oriented to person, place, and time. She appears well-developed and well-nourished.  HENT:  Head: Normocephalic and atraumatic.  Eyes: Conjunctivae and EOM are normal. Pupils are equal, round, and reactive to light.  Neck: Normal range of motion. Neck supple.  Cardiovascular: Normal rate, regular rhythm, normal heart sounds and intact distal pulses.   Pulmonary/Chest: Effort normal and breath sounds normal. Right breast exhibits no mass, no skin change and no tenderness. Left breast exhibits no mass, no skin change and no tenderness. Breasts are symmetrical.  Abdominal: Soft. Bowel sounds are normal.  Genitourinary: Vagina normal and uterus normal. Rectal exam shows no fissure. No breast swelling, tenderness, discharge or bleeding. There is no tenderness or lesion on the right labia. There is no tenderness or lesion on the left labia. Uterus is not deviated, not enlarged and not tender. Cervix exhibits no motion tenderness, no discharge and no friability. Right adnexum displays no mass, no tenderness and no fullness. Left adnexum displays no mass, no tenderness and no fullness. No tenderness or bleeding in the vagina. No vaginal discharge found.  Neurological: She is alert and oriented to person, place, and time. She has normal reflexes.  Skin: Skin is warm and dry. No rash noted.  Psychiatric: She has a normal mood and affect. Her behavior is normal. Judgment and thought content normal.    Results for orders placed or performed in visit on 03/04/16  CBC with Differential/Platelet  Result Value Ref Range   WBC 5.5 3.4 - 10.8 x10E3/uL   RBC 4.56 3.77 - 5.28 x10E6/uL   Hemoglobin 14.4 11.1 - 15.9 g/dL   Hematocrit 41.9 34.0 - 46.6 %    MCV 92 79 - 97 fL   MCH 31.6 26.6 - 33.0 pg   MCHC 34.4 31.5 - 35.7 g/dL   RDW 12.9 12.3 - 15.4 %   Platelets 238 150 - 379 x10E3/uL   Neutrophils 65 Not Estab. %   Lymphs 23 Not Estab. %   Monocytes 8 Not Estab. %   Eos 4 Not Estab. %   Basos 0 Not Estab. %   Neutrophils Absolute 3.6 1.4 - 7.0 x10E3/uL   Lymphocytes Absolute 1.3 0.7 - 3.1 x10E3/uL  Monocytes Absolute 0.5 0.1 - 0.9 x10E3/uL   EOS (ABSOLUTE) 0.2 0.0 - 0.4 x10E3/uL   Basophils Absolute 0.0 0.0 - 0.2 x10E3/uL   Immature Granulocytes 0 Not Estab. %   Immature Grans (Abs) 0.0 0.0 - 0.1 x10E3/uL  CMP14+EGFR  Result Value Ref Range   Glucose 76 65 - 99 mg/dL   BUN 17 6 - 24 mg/dL   Creatinine, Ser 0.63 0.57 - 1.00 mg/dL   GFR calc non Af Amer 98 >59 mL/min/1.73   GFR calc Af Amer 114 >59 mL/min/1.73   BUN/Creatinine Ratio 27 (H) 9 - 23   Sodium 142 134 - 144 mmol/L   Potassium 4.3 3.5 - 5.2 mmol/L   Chloride 99 96 - 106 mmol/L   CO2 28 18 - 29 mmol/L   Calcium 9.5 8.7 - 10.2 mg/dL   Total Protein 7.4 6.0 - 8.5 g/dL   Albumin 4.7 3.5 - 5.5 g/dL   Globulin, Total 2.7 1.5 - 4.5 g/dL   Albumin/Globulin Ratio 1.7 1.2 - 2.2   Bilirubin Total 0.4 0.0 - 1.2 mg/dL   Alkaline Phosphatase 84 39 - 117 IU/L   AST 18 0 - 40 IU/L   ALT 19 0 - 32 IU/L  Lipid panel  Result Value Ref Range   Cholesterol, Total 203 (H) 100 - 199 mg/dL   Triglycerides 154 (H) 0 - 149 mg/dL   HDL 50 >39 mg/dL   VLDL Cholesterol Cal 31 5 - 40 mg/dL   LDL Calculated 122 (H) 0 - 99 mg/dL   Chol/HDL Ratio 4.1 0.0 - 4.4 ratio units      Assessment & Plan:   1. Well adult exam - CBC with Differential/Platelet - CMP14+EGFR - Lipid panel - Pap IG w/ reflex to HPV when ASC-U  2. Encounter for gynecological examination without abnormal finding - Pap IG w/ reflex to HPV when ASC-U  3. Mixed hyperlipidemia - simvastatin (ZOCOR) 40 MG tablet; Take 1 tablet (40 mg total) by mouth every evening.  Dispense: 90 tablet; Refill: 3 - Lipid panel  4.  Essential hypertension - hydrochlorothiazide (HYDRODIURIL) 25 MG tablet; Take 1 tablet (25 mg total) by mouth daily.  Dispense: 90 tablet; Refill: 3 - lisinopril (PRINIVIL,ZESTRIL) 5 MG tablet; Take 1 tablet (5 mg total) by mouth daily.  Dispense: 90 tablet; Refill: 0 - CBC with Differential/Platelet - CMP14+EGFR - Lipid panel  5. Acute maxillary sinusitis, recurrence not specified - amoxicillin (AMOXIL) 500 MG capsule; Take 2 capsules (1,000 mg total) by mouth 2 (two) times daily.  Dispense: 40 capsule; Refill: 1  6. Recurrent major depressive disorder, in full remission ([place]) - ALPRAZolam (XANAX) 0.5 MG tablet; Take 1 tablet (0.5 mg total) by mouth at bedtime as needed for sleep.  Dispense: 30 tablet; Refill: 5 - buPROPion (WELLBUTRIN SR) 150 MG 12 hr tablet; Take 1 tablet (150 mg total) by mouth daily.  Dispense: 90 tablet; Refill: 3  7. Arthralgia of hand, unspecified laterality - diclofenac (VOLTAREN) 75 MG EC tablet; Take 1 tablet (75 mg total) by mouth 2 (two) times daily.  Dispense: 180 tablet; Refill: 3  8. Gastroesophageal reflux disease without esophagitis - omeprazole (PRILOSEC) 20 MG capsule; Take 1 capsule (20 mg total) by mouth daily.  Dispense: 90 capsule; Refill: 3  9. Chronic nonseasonal allergic rhinitis due to pollen - budesonide (RHINOCORT AQUA) 32 MCG/ACT nasal spray; Place 1 spray into both nostrils 2 (two) times daily.  Dispense: 1 Bottle; Refill: 11 - loratadine (CLARITIN) 10 MG tablet;  Take 1 tablet (10 mg total) by mouth daily.  Dispense: 30 tablet; Refill: 0   Continue all other maintenance medications as listed above. Educational handout given for DASH eating plan  Follow up plan: Return in about 6 months (around 09/02/2016) for recheck.  Terald Sleeper PA-C [facility] [address]  [place], [place] [address] [phone]   03/06/2016, 2:26 PM

## 2016-03-07 LAB — PAP IG W/ RFLX HPV ASCU: PAP Smear Comment: 0

## 2016-04-02 ENCOUNTER — Ambulatory Visit
Admission: RE | Admit: 2016-04-02 | Discharge: 2016-04-02 | Disposition: A | Discharge status: Home / Self Care | Payer: BLUE CROSS/BLUE SHIELD | Source: Ambulatory Visit | Attending: Physician Assistant | Admitting: Physician Assistant

## 2016-04-02 DIAGNOSIS — Z1231 Encounter for screening mammogram for malignant neoplasm of breast: Secondary | ICD-10-CM

## 2016-05-21 ENCOUNTER — Other Ambulatory Visit: Payer: Self-pay | Admitting: *Deleted

## 2016-05-21 DIAGNOSIS — E782 Mixed hyperlipidemia: Secondary | ICD-10-CM

## 2016-05-21 NOTE — Telephone Encounter (Signed)
Pharmacy requested 80mg  of simvastatin and I called to verify with patient and she is on 40mg  and does not need the 80mg .

## 2016-06-06 ENCOUNTER — Ambulatory Visit (INDEPENDENT_AMBULATORY_CARE_PROVIDER_SITE_OTHER): Payer: BLUE CROSS/BLUE SHIELD | Admitting: Physician Assistant

## 2016-06-06 ENCOUNTER — Encounter: Payer: Self-pay | Admitting: Physician Assistant

## 2016-06-06 VITALS — BP 132/81 | HR 94 | Temp 99.4°F | Ht 63.0 in | Wt 168.0 lb

## 2016-06-06 DIAGNOSIS — J40 Bronchitis, not specified as acute or chronic: Secondary | ICD-10-CM | POA: Diagnosis not present

## 2016-06-06 DIAGNOSIS — L853 Xerosis cutis: Secondary | ICD-10-CM

## 2016-06-06 MED ORDER — CEFDINIR 300 MG PO CAPS: 300.0000 mg | ORAL_CAPSULE | Freq: Two times a day (BID) | ORAL | 0 refills | Status: DC

## 2016-06-06 MED ORDER — METHYLPREDNISOLONE ACETATE 80 MG/ML IJ SUSP
80.0000 mg | Freq: Once | INTRAMUSCULAR | Status: AC
  Administered 2016-06-06: 80 mg via INTRAMUSCULAR

## 2016-06-06 MED ORDER — HYDROCODONE-HOMATROPINE 5-1.5 MG/5ML PO SYRP
5.0000 mL | ORAL_SOLUTION | Freq: Four times a day (QID) | ORAL | 0 refills | Status: DC | PRN
Start: 2016-06-06 — End: 2016-12-09

## 2016-06-06 NOTE — Progress Notes (Signed)
BP 132/81   Pulse 94   Temp 99.4 F (37.4 C) (Oral)   Ht 5\' 3"  (1.6 m)   Wt 168 lb (76.2 kg)   BMI 29.76 kg/m    Subjective:    Patient ID: Kristen Arroyo, female    DOB: July 26, 1955, 61 y.o.   MRN: [id number]  HPI: Kristen Arroyo is a 61 y.o. female presenting on 06/06/2016 for Cough; Nasal Congestion; Sore Throat; and Hoarse  Patient with several days of progressing upper respiratory and bronchial symptoms. Initially there was more upper respiratory congestion. This progressed to having significant cough that is productive throughout the day and severe at night. There is occasional wheezing after coughing. They will sometimes have slight dyspnea on exertion. It is productive mucus that is yellow in color. Denies any blood.   Relevant past medical, surgical, family and social history reviewed and updated as indicated. Allergies and medications reviewed and updated.  Past Medical History:  Diagnosis Date  . Anxiety   . Arthritis    knees  . GERD (gastroesophageal reflux disease)   . History of seasonal allergies   . Hyperlipidemia   . Hypertension     Past Surgical History:  Procedure Laterality Date  . CARPAL TUNNEL RELEASE  2006   right  . KNEE ARTHROSCOPY     left: torn meniscus    Review of Systems  Constitutional: Positive for chills and fatigue. Negative for activity change and appetite change.  HENT: Positive for congestion, postnasal drip, sinus pressure and sore throat.   Eyes: Negative.   Respiratory: Positive for cough and wheezing. Negative for shortness of breath.   Cardiovascular: Negative.  Negative for chest pain, palpitations and leg swelling.  Gastrointestinal: Negative.   Genitourinary: Negative.   Musculoskeletal: Negative.   Skin: Negative.   Neurological: Positive for headaches.    Allergies as of 06/06/2016   No Known Allergies     Medication List       Accurate as of 06/06/16  4:16 PM. Always use your most recent med list.          ALPRAZolam 0.5 MG tablet Commonly known as:  XANAX Take 1 tablet (0.5 mg total) by mouth at bedtime as needed for sleep.   BENADRYL ALLERGY PO Take by mouth as needed.   budesonide 32 MCG/ACT nasal spray Commonly known as:  RHINOCORT AQUA Place 1 spray into both nostrils 2 (two) times daily.   buPROPion 150 MG 12 hr tablet Commonly known as:  WELLBUTRIN SR Take 1 tablet (150 mg total) by mouth daily.   cefdinir 300 MG capsule Commonly known as:  OMNICEF Take 1 capsule (300 mg total) by mouth 2 (two) times daily. 1 po BID   diclofenac 75 MG EC tablet Commonly known as:  VOLTAREN Take 1 tablet (75 mg total) by mouth 2 (two) times daily.   hydrochlorothiazide 25 MG tablet Commonly known as:  HYDRODIURIL Take 1 tablet (25 mg total) by mouth daily.   HYDROcodone-homatropine 5-1.5 MG/5ML syrup Commonly known as:  HYCODAN Take 5-10 mLs by mouth every 6 (six) hours as needed.   lisinopril 5 MG tablet Commonly known as:  PRINIVIL,ZESTRIL Take 1 tablet (5 mg total) by mouth daily.   loratadine 10 MG tablet Commonly known as:  CLARITIN Take 1 tablet (10 mg total) by mouth daily.   multivitamin tablet Take 1 tablet by mouth daily.   omeprazole 20 MG capsule Commonly known as:  PRILOSEC Take 1 capsule (20 mg total)  by mouth daily.   simvastatin 40 MG tablet Commonly known as:  ZOCOR Take 1 tablet (40 mg total) by mouth every evening.          Objective:    BP 132/81   Pulse 94   Temp 99.4 F (37.4 C) (Oral)   Ht 5\' 3"  (1.6 m)   Wt 168 lb (76.2 kg)   BMI 29.76 kg/m   No Known Allergies  Physical Exam  Constitutional: She is oriented to person, place, and time. She appears well-developed and well-nourished.  HENT:  Head: Normocephalic and atraumatic.  Right Ear: There is drainage and tenderness.  Left Ear: There is drainage and tenderness.  Nose: Mucosal edema and rhinorrhea present. Right sinus exhibits maxillary sinus tenderness and frontal sinus  tenderness. Left sinus exhibits maxillary sinus tenderness and frontal sinus tenderness.  Mouth/Throat: Oropharyngeal exudate and posterior oropharyngeal erythema present.  Eyes: Conjunctivae and EOM are normal. Pupils are equal, round, and reactive to light.  Neck: Normal range of motion. Neck supple.  Cardiovascular: Normal rate, regular rhythm, normal heart sounds and intact distal pulses.   Pulmonary/Chest: Effort normal. She has wheezes in the right upper field and the left upper field.  Abdominal: Soft. Bowel sounds are normal.  Neurological: She is alert and oriented to person, place, and time. She has normal reflexes.  Skin: Skin is warm and dry. No rash noted.  Psychiatric: She has a normal mood and affect. Her behavior is normal. Judgment and thought content normal.  Nursing note and vitals reviewed.       Assessment & Plan:   1. Bronchitis - cefdinir (OMNICEF) 300 MG capsule; Take 1 capsule (300 mg total) by mouth 2 (two) times daily. 1 po BID  Dispense: 20 capsule; Refill: 0 - methylPREDNISolone acetate (DEPO-MEDROL) injection 80 mg; Inject 1 mL (80 mg total) into the muscle once. - HYDROcodone-homatropine (HYCODAN) 5-1.5 MG/5ML syrup; Take 5-10 mLs by mouth every 6 (six) hours as needed.  Dispense: 240 mL; Refill: 0  2. Dry skin coconut oil frequently  Continue all other maintenance medications as listed above.  Follow up plan: Return if symptoms worsen or fail to improve.  Educational handout given for bronchitis  Remus LofflerAngel S. Chandlar Staebell PA-C [facility] [address]  [place], [place] [address] [phone]   06/06/2016, 4:16 PM

## 2016-06-06 NOTE — Patient Instructions (Signed)

## 2016-07-02 ENCOUNTER — Other Ambulatory Visit: Payer: Self-pay | Admitting: Physician Assistant

## 2016-07-02 DIAGNOSIS — I10 Essential (primary) hypertension: Secondary | ICD-10-CM

## 2016-10-22 ENCOUNTER — Other Ambulatory Visit: Payer: Self-pay | Admitting: Physician Assistant

## 2016-10-22 DIAGNOSIS — F3342 Major depressive disorder, recurrent, in full remission: Secondary | ICD-10-CM

## 2016-10-22 DIAGNOSIS — I10 Essential (primary) hypertension: Secondary | ICD-10-CM

## 2016-10-23 NOTE — Telephone Encounter (Signed)
Last seen 06/06/16   Kristen Arroyo  If approved [facility]

## 2016-10-24 NOTE — Telephone Encounter (Signed)
Please phone in alprazolam.

## 2016-10-24 NOTE — Telephone Encounter (Signed)
Phoned in.

## 2016-11-05 ENCOUNTER — Telehealth: Payer: Self-pay

## 2016-11-05 NOTE — Telephone Encounter (Signed)
Talked about a stress test last time I was in  Wants to do that  My husband sees Dr Excell Seltzerooper

## 2016-11-06 ENCOUNTER — Telehealth: Payer: Self-pay | Admitting: *Deleted

## 2016-11-06 DIAGNOSIS — Z8249 Family history of ischemic heart disease and other diseases of the circulatory system: Secondary | ICD-10-CM

## 2016-11-06 DIAGNOSIS — Z136 Encounter for screening for cardiovascular disorders: Secondary | ICD-10-CM

## 2016-11-06 NOTE — Telephone Encounter (Signed)
Pt requesting referral to cardiology for stress test Pt prefers Dr Excell Seltzerooper Please advise

## 2016-11-06 NOTE — Telephone Encounter (Signed)
Please place referral to this cardiology for screening of heart disease, family history significant

## 2016-11-06 NOTE — Telephone Encounter (Signed)
Duplicate request

## 2016-11-07 NOTE — Telephone Encounter (Signed)
Referral placed for patient.  

## 2016-11-14 ENCOUNTER — Encounter: Payer: Self-pay | Admitting: *Deleted

## 2016-12-08 NOTE — Progress Notes (Addendum)
Cardiology Office Note:    Date:  12/09/2016   ID:  Kristen Arroyo, DOB 10-17-1955, MRN [id number]  PCP:  Remus Loffler, PA-C  Cardiologist:  New-Dr. Elease Hashimoto  Referring MD: Remus Loffler, PA-C   Chief Complaint  Patient presents with  . Chest Pain    History of Present Illness:    Kristen Arroyo is a 61 y.o. female who is being seen today for the evaluation of chest discomfort and screening for heart disease at the request of Remus Loffler, PA-C.  Kristen Arroyo is a 61 y.o. female with a past medical history significant for arthritis, anxiety, GERD, hyperlipidemia, hypertension. The patient was referred to cardiology by Prudy Feeler, PA for screening of heart disease. The patient has family history significant for heart disease. The patient has no previous cardiac issues. She had a normal stress test many years ago (around 46 Yrs). She has never been a smoker. No history of diabetes. No alcohol use.   The patient is here today with her husband. The patient has had occasional chest tightness, aching in the jaw and back pain that occurs at rest usually when she is relaxing after she gets off work. This has been occurring for about the last year about one to 3 times per month. She also has mild shortness of breath with this. The discomfort lasts about 10-30 minutes, resolving when she chews 3-4 baby aspirin and relaxes. She does not have symptoms with exertion. She is concerned because of her strong family history of cardiac issues. Her mother had severe heart failure. She has 5 brothers all of which have hypertension and one has had stents and one has had bypass surgery in their 38's. She denies orthopnea, PND, palpitations, syncope/near syncope. She notes an occasional skipped heartbeat which is not bothersome. She has mild ankle edema after working all day which is resolved by morning.  She does not participate in regular exercise but is very active walking around [place] all day  for work.   She has leg pain, hip and feet aching that she relates to working on her feet all day at [employer]. She uses diclofenac for this. She denies pain in the calves that occurs with walking and resolves with rest.    Past Medical History:  Diagnosis Date  . Anxiety   . Arthritis    knees  . GERD (gastroesophageal reflux disease)   . History of seasonal allergies   . Hyperlipidemia   . Hypertension     Past Surgical History:  Procedure Laterality Date  . CARPAL TUNNEL RELEASE  2006   right  . KNEE ARTHROSCOPY     left: torn meniscus    Current Medications: Current Meds  Medication Sig  . ALPRAZolam (XANAX) 0.5 MG tablet TAKE ONE TABLET BY MOUTH ONCE DAILY AT BEDTIME AS NEEDED FOR SLEEP.  Marland Kitchen buPROPion (WELLBUTRIN SR) 150 MG 12 hr tablet Take 1 tablet (150 mg total) by mouth daily.  . calcium-vitamin D (OSCAL WITH D) 500-200 MG-UNIT tablet Take 1 tablet by mouth daily.  . diclofenac (VOLTAREN) 75 MG EC tablet Take 1 tablet (75 mg total) by mouth 2 (two) times daily.  . DiphenhydrAMINE HCl (BENADRYL ALLERGY PO) Take by mouth as needed.  . hydrochlorothiazide (HYDRODIURIL) 25 MG tablet Take 1 tablet (25 mg total) by mouth daily.  Marland Kitchen lactase (LACTAID) 3000 units tablet Take 3,000 Units by mouth as needed (STOMACH UPSET).  Marland Kitchen lisinopril (PRINIVIL,ZESTRIL) 5 MG tablet TAKE 1 TABLET  BY MOUTH ONCE DAILY  . loratadine (CLARITIN) 10 MG tablet Take 1 tablet (10 mg total) by mouth daily.  . Multiple Vitamin (MULTIVITAMIN) tablet Take 1 tablet by mouth daily.  Marland Kitchen omeprazole (PRILOSEC) 20 MG capsule Take 1 capsule (20 mg total) by mouth daily.  . [DISCONTINUED] simvastatin (ZOCOR) 40 MG tablet Take 1 tablet (40 mg total) by mouth every evening.     Allergies:   Patient has no known allergies.   Social History   Social History  . Marital status: Married    Spouse name: N/A  . Number of children: 2  . Years of education: N/A   Occupational History  . Technical brewer   Social  History Main Topics  . Smoking status: Never Smoker  . Smokeless tobacco: Never Used  . Alcohol use No  . Drug use: No  . Sexual activity: Not Asked   Other Topics Concern  . None   Social History Narrative  . None     Family History: The patient's family history includes CAD in her brother and brother; Diabetes in her brother; Heart failure in her mother; Hypertension in her brother, brother, brother, brother, brother, father, and mother; Multiple sclerosis in her brother; Peripheral vascular disease in her mother; Stroke in her father. There is no history of Colon cancer. ROS:   Please see the history of present illness.     All other systems reviewed and are negative.  EKGs/Labs/Other Studies Reviewed:    The following studies were reviewed today:  None available.   EKG:  EKG is  ordered today.  The ekg ordered today demonstrates normal sinus rhythm at 73 bpm, QTC 453 ms, no acute changes. No previous EKG for comparison.  Recent Labs: 03/04/2016: ALT 19; BUN 17; Creatinine, Ser 0.63; Hemoglobin 14.4; Platelets 238; Potassium 4.3; Sodium 142   Recent Lipid Panel    Component Value Date/Time   CHOL 203 (H) 03/04/2016 1309   TRIG 154 (H) 03/04/2016 1309   HDL 50 03/04/2016 1309   CHOLHDL 4.1 03/04/2016 1309   LDLCALC 122 (H) 03/04/2016 1309    Physical Exam:    VS:  BP (!) 144/90   Pulse 74   Ht  (1.6 m)   Wt 169 lb 1.9 oz (76.7 kg)   SpO2 96%   BMI 29.96 kg/m     Wt Readings from Last 3 Encounters:  12/09/16 169 lb 1.9 oz (76.7 kg)  06/06/16 168 lb (76.2 kg)  03/04/16 164 lb 3.2 oz (74.5 kg)     Physical Exam  Constitutional: She is oriented to person, place, and time. She appears well-developed and well-nourished. No distress.  HENT:  Head: Normocephalic and atraumatic.  Neck: Normal range of motion. Neck supple. No JVD present. Carotid bruit is not present.  Cardiovascular: Normal rate, regular rhythm, normal heart sounds and intact distal pulses.   Exam reveals no gallop and no friction rub.   No murmur heard. Pulmonary/Chest: Effort normal and breath sounds normal. No respiratory distress. She has no wheezes. She has no rales. She exhibits no tenderness.  Abdominal: Soft. Bowel sounds are normal. There is no tenderness.  Musculoskeletal: Normal range of motion. She exhibits no edema or deformity.  Neurological: She is alert and oriented to person, place, and time.  Skin: Skin is warm and dry.  Psychiatric: She has a normal mood and affect. Her behavior is normal. Thought content normal.    ASSESSMENT:    1. Chest pain, unspecified type  2. Essential hypertension   3. Mixed hyperlipidemia    PLAN:    In order of problems listed above:  Chest discomfort -The patient has no previous cardiovascular events. -CVD risk factors include hypertension, hyperlipidemia, and positive family history  -According to the ACC/AHA CV risk calculator this patient has a 10 year risk for ASCVD of 5.1% compared to someone of the same age with optimal risk factors of 2.3% -She has atypical chest discomfort and mild aching in the jaw that does not occur with activity. Usually occurs at rest just after getting home from work, 1-3 times per month for the last year. Mild SOB associated. -Will check nuclear stress test and echocardiogram.  -Add aspirin 81 mg daily -Follow up after testing.   Hypertension -Currently managed with hydrochlorothiazide 25 mg, lisinopril 5 mg -BP elevated today. Usually well controlled at her PCP office. Follow up at next visit.  Hyperlipidemia  -Treated with simvastatin 40 mg daily -Most recent lipid panel on 03/04/2016 shows total cholesterol 203, LDL 122, HDL 50 -Will change to high intensity statin for better lipid lowering considering her strong family hx of CAD. Rosuvastatin 20 mg daily. Will need follow up lipid panel in 3 months.   This patient was discussed with Dr. Elease HashimotoNahser, DOD, who assessed patient and was  involved with the plan.   Medication Adjustments/Labs and Tests Ordered: Current medicines are reviewed at length with the patient today.  Concerns regarding medicines are outlined above. Labs and tests ordered and medication changes are outlined in the patient instructions below:  Patient Instructions  Medication Instructions:  1. STOP SIMVASTATIN  2. START CRESTOR 20 MG DAILY; RX HAS BEEN SENT IN  3. START ASPIRIN 81 MG DAILY  Labwork: NONE ORDERED TODAY  Testing/Procedures: 1. Your physician has requested that you have an echocardiogram. Echocardiography is a painless test that uses sound waves to create images of your heart. It provides your doctor with information about the size and shape of your heart and how well your heart's chambers and valves are working. This procedure takes approximately one hour. There are no restrictions for this procedure.  2. Your physician has requested that you have en exercise stress myoview. For further information please visit [email or web address]. Please follow instruction sheet, as given.    Follow-[facility]: [facility]EE[facility]   Any Other Special Instructions Will Be Listed Below (If Applicable).     If you need a refill on your cardiac medications before your next appointment, please call your pharmacy.   Signed, Berton BonJanine Nadina Fomby, NP  12/09/2016 4:00 PM    [facility]

## 2016-12-09 ENCOUNTER — Ambulatory Visit (INDEPENDENT_AMBULATORY_CARE_PROVIDER_SITE_OTHER): Payer: BLUE CROSS/BLUE SHIELD | Admitting: Cardiology

## 2016-12-09 ENCOUNTER — Encounter (INDEPENDENT_AMBULATORY_CARE_PROVIDER_SITE_OTHER): Payer: Self-pay

## 2016-12-09 ENCOUNTER — Encounter: Payer: Self-pay | Admitting: Physician Assistant

## 2016-12-09 VITALS — BP 144/90 | HR 74 | Ht 63.0 in | Wt 169.1 lb

## 2016-12-09 DIAGNOSIS — E782 Mixed hyperlipidemia: Secondary | ICD-10-CM | POA: Diagnosis not present

## 2016-12-09 DIAGNOSIS — I10 Essential (primary) hypertension: Secondary | ICD-10-CM | POA: Diagnosis not present

## 2016-12-09 DIAGNOSIS — R079 Chest pain, unspecified: Secondary | ICD-10-CM

## 2016-12-09 MED ORDER — ASPIRIN EC 81 MG PO TBEC: 81.0000 mg | DELAYED_RELEASE_TABLET | Freq: Every day | ORAL | Status: AC

## 2016-12-09 MED ORDER — ROSUVASTATIN CALCIUM 20 MG PO TABS: 20.0000 mg | ORAL_TABLET | Freq: Every day | ORAL | 3 refills | Status: DC

## 2016-12-09 NOTE — Patient Instructions (Signed)
Medication Instructions:  1. STOP SIMVASTATIN  2. START CRESTOR 20 MG DAILY; RX HAS BEEN SENT IN  3. START ASPIRIN 81 MG DAILY  Labwork: NONE ORDERED TODAY  Testing/Procedures: 1. Your physician has requested that you have an echocardiogram. Echocardiography is a painless test that uses sound waves to create images of your heart. It provides your doctor with information about the size and shape of your heart and how well your heart's chambers and valves are working. This procedure takes approximately one hour. There are no restrictions for this procedure.  2. Your physician has requested that you have en exercise stress myoview. For further information please visit [email or web address]. Please follow instruction sheet, as given.    Follow-Up: 2-3 WEEKS [facility]   Any Other Special Instructions Will Be Listed Below (If Applicable).     If you need a refill on your cardiac medications before your next appointment, please call your pharmacy.

## 2016-12-11 ENCOUNTER — Telehealth (HOSPITAL_COMMUNITY): Payer: Self-pay | Admitting: *Deleted

## 2016-12-11 NOTE — Telephone Encounter (Signed)
Left message on voicemail per DPR in reference to upcoming appointment scheduled on 12/16/16 with detailed instructions given per Myocardial Perfusion Study Information Sheet for the test. LM to arrive 15 minutes early, and that it is imperative to arrive on time for appointment to keep from having the test rescheduled. If you need to cancel or reschedule your appointment, please call the office within 24 hours of your appointment. Failure to do so may result in a cancellation of your appointment, and a $50 no show fee. Phone number given for call back for any questions. [name]    

## 2016-12-16 ENCOUNTER — Ambulatory Visit (HOSPITAL_BASED_OUTPATIENT_CLINIC_OR_DEPARTMENT_OTHER): Payer: BLUE CROSS/BLUE SHIELD

## 2016-12-16 ENCOUNTER — Encounter (INDEPENDENT_AMBULATORY_CARE_PROVIDER_SITE_OTHER): Payer: Self-pay

## 2016-12-16 ENCOUNTER — Other Ambulatory Visit: Payer: Self-pay

## 2016-12-16 ENCOUNTER — Ambulatory Visit (HOSPITAL_COMMUNITY): Payer: BLUE CROSS/BLUE SHIELD | Attending: Cardiology

## 2016-12-16 DIAGNOSIS — E785 Hyperlipidemia, unspecified: Secondary | ICD-10-CM | POA: Diagnosis not present

## 2016-12-16 DIAGNOSIS — I10 Essential (primary) hypertension: Secondary | ICD-10-CM | POA: Diagnosis not present

## 2016-12-16 DIAGNOSIS — R079 Chest pain, unspecified: Secondary | ICD-10-CM | POA: Diagnosis not present

## 2016-12-16 LAB — MYOCARDIAL PERFUSION IMAGING
CHL CUP MPHR: 160 {beats}/min
CHL CUP NUCLEAR SDS: 7
CHL CUP NUCLEAR SSS: 13
Estimated workload: 10.1 METS
Exercise duration (min): 9 min
LHR: 0.34
LVDIAVOL: 63 mL (ref 46–106)
LVSYSVOL: 22 mL
NUC STRESS TID: 1.27
Peak HR: 148 {beats}/min
Percent HR: 92 %
RPE: 18
Rest HR: 73 {beats}/min
SRS: 6

## 2016-12-16 MED ORDER — TECHNETIUM TC 99M TETROFOSMIN IV KIT
32.7000 | PACK | Freq: Once | INTRAVENOUS | Status: AC | PRN
Start: 2016-12-16 — End: 2016-12-16
  Administered 2016-12-16: 32.7 via INTRAVENOUS
  Filled 2016-12-16: qty 33

## 2016-12-16 MED ORDER — TECHNETIUM TC 99M TETROFOSMIN IV KIT
10.6000 | PACK | Freq: Once | INTRAVENOUS | Status: AC | PRN
  Administered 2016-12-16: 10.6 via INTRAVENOUS
  Filled 2016-12-16: qty 11

## 2016-12-19 ENCOUNTER — Telehealth: Payer: Self-pay | Admitting: Physician Assistant

## 2016-12-19 NOTE — Telephone Encounter (Signed)
Pt has been notified of both echo and myoview results and findings. Pt aware per Berton Bon, NP Tereso Newcomer, PA will go over in more detail at 01/05/17 appt. Pt thanked me for my call today.

## 2016-12-19 NOTE — Telephone Encounter (Signed)
New message    Pt is calling about her test results.

## 2016-12-19 NOTE — Telephone Encounter (Signed)
-----   Message from Tarri Fuller, [place] sent at 12/19/2016  9:37 AM EDT -----   ----- Message ----- From: Berton Bon, NP Sent: 12/18/2016   7:46 AM To: [facility] Triage  Please let pt know that her echo showed normal pumping function of the heart and a trace amount of leakiness of the aortic valve. Nothing concerning. Lorin Picket can review her tests in detail at her follow up.  Please send copy to Remus Loffler, PA-C  Berton Bon, NP

## 2017-01-05 ENCOUNTER — Ambulatory Visit: Payer: BLUE CROSS/BLUE SHIELD | Admitting: Physician Assistant

## 2017-01-06 ENCOUNTER — Ambulatory Visit (INDEPENDENT_AMBULATORY_CARE_PROVIDER_SITE_OTHER): Payer: BLUE CROSS/BLUE SHIELD | Admitting: Family Medicine

## 2017-01-06 ENCOUNTER — Encounter: Payer: Self-pay | Admitting: Family Medicine

## 2017-01-06 VITALS — BP 128/83 | HR 84 | Temp 98.5°F | Ht 63.0 in | Wt 168.6 lb

## 2017-01-06 DIAGNOSIS — J181 Lobar pneumonia, unspecified organism: Secondary | ICD-10-CM

## 2017-01-06 DIAGNOSIS — J189 Pneumonia, unspecified organism: Secondary | ICD-10-CM

## 2017-01-06 MED ORDER — AZITHROMYCIN 250 MG PO TABS: ORAL_TABLET | ORAL | 0 refills | Status: DC

## 2017-01-06 MED ORDER — GUAIFENESIN-CODEINE 100-10 MG/5ML PO SOLN: 5.0000 mL | Freq: Three times a day (TID) | ORAL | 0 refills | Status: DC | PRN

## 2017-01-06 NOTE — Patient Instructions (Signed)
Great to meet you!    Community-Acquired Pneumonia, Adult Pneumonia is an infection of the lungs. There are different types of pneumonia. One type can develop while a person is in a hospital. A different type, called community-acquired pneumonia, develops in people who are not, or have not recently been, in the hospital or other health care facility. What are the causes? Pneumonia may be caused by bacteria, viruses, or funguses. Community-acquired pneumonia is often caused by Streptococcus pneumonia bacteria. These bacteria are often passed from one person to another by breathing in droplets from the cough or sneeze of an infected person. What increases the risk? The condition is more likely to develop in:  People who havechronic diseases, such as chronic obstructive pulmonary disease (COPD), asthma, congestive heart failure, cystic fibrosis, diabetes, or kidney disease.  People who haveearly-stage or late-stage HIV.  People who havesickle cell disease.  People who havehad their spleen removed (splenectomy).  People who havepoor dental hygiene.  People who havemedical conditions that increase the risk of breathing in (aspirating) secretions their own mouth and nose.  People who havea weakened immune system (immunocompromised).  People who smoke.  People whotravel to areas where pneumonia-causing germs commonly exist.  People whoare around animal habitats or animals that have pneumonia-causing germs, including birds, bats, rabbits, cats, and farm animals.  What are the signs or symptoms? Symptoms of this condition include:  Adry cough.  A wet (productive) cough.  Fever.  Sweating.  Chest pain, especially when breathing deeply or coughing.  Rapid breathing or difficulty breathing.  Shortness of breath.  Shaking chills.  Fatigue.  Muscle aches.  How is this diagnosed? Your health care provider will take a medical history and perform a physical exam. You  may also have other tests, including:  Imaging studies of your chest, including X-rays.  Tests to check your blood oxygen level and other blood gases.  Other tests on blood, mucus (sputum), fluid around your lungs (pleural fluid), and urine.  If your pneumonia is severe, other tests may be done to identify the specific cause of your illness. How is this treated? The type of treatment that you receive depends on many factors, such as the cause of your pneumonia, the medicines you take, and other medical conditions that you have. For most adults, treatment and recovery from pneumonia may occur at home. In some cases, treatment must happen in a hospital. Treatment may include:  Antibiotic medicines, if the pneumonia was caused by bacteria.  Antiviral medicines, if the pneumonia was caused by a virus.  Medicines that are given by mouth or through an IV tube.  Oxygen.  Respiratory therapy.  Although rare, treating severe pneumonia may include:  Mechanical ventilation. This is done if you are not breathing well on your own and you cannot maintain a safe blood oxygen level.  Thoracentesis. This procedureremoves fluid around one lung or both lungs to help you breathe better.  Follow these instructions at home:  Take over-the-counter and prescription medicines only as told by your health care provider. ? Only takecough medicine if you are losing sleep. Understand that cough medicine can prevent your body's natural ability to remove mucus from your lungs. ? If you were prescribed an antibiotic medicine, take it as told by your health care provider. Do not stop taking the antibiotic even if you start to feel better.  Sleep in a semi-upright position at night. Try sleeping in a reclining chair, or place a few pillows under your head.  Do   not use tobacco products, including cigarettes, chewing tobacco, and e-cigarettes. If you need help quitting, ask your health care provider.  Drink  enough water to keep your urine clear or pale yellow. This will help to thin out mucus secretions in your lungs. How is this prevented? There are ways that you can decrease your risk of developing community-acquired pneumonia. Consider getting a pneumococcal vaccine if:  You are older than 60 years of age.  You are older than 60 years of age and are undergoing cancer treatment, have chronic lung disease, or have other medical conditions that affect your immune system. Ask your health care provider if this applies to you.  There are different types and schedules of pneumococcal vaccines. Ask your health care provider which vaccination option is best for you. You may also prevent community-acquired pneumonia if you take these actions:  Get an influenza vaccine every year. Ask your health care provider which type of influenza vaccine is best for you.  Go to the dentist on a regular basis.  Wash your hands often. Use hand sanitizer if soap and water are not available.  Contact a health care provider if:  You have a fever.  You are losing sleep because you cannot control your cough with cough medicine. Get help right away if:  You have worsening shortness of breath.  You have increased chest pain.  Your sickness becomes worse, especially if you are an older adult or have a weakened immune system.  You cough up blood. This information is not intended to replace advice given to you by your health care provider. Make sure you discuss any questions you have with your health care provider. Document Released: 03/17/2005 Document Revised: 07/26/2015 Document Reviewed: 07/12/2014 Elsevier Interactive Patient Education  2017 Elsevier Inc.  

## 2017-01-06 NOTE — Progress Notes (Signed)
   HPI  Patient presents today here with cough.  Patient's lines she's had 3 days of symptoms with cough productive of green sputum, right greater than left ear pain, congestion, and sore throat.  She also has malaise and subjective fever. She denies any significant shortness of breath.  She has decreased appetite but normal food and fluid tolerance.  She does not frequently get sicker take antibiotics. She works at [employer] and has had many sick exposures.  PMH: Smoking status noted ROS: Per HPI  Objective: BP 128/83   Pulse 84   Temp 98.5 F (36.9 C) (Oral)   Ht  (1.6 m)   Wt 168 lb 9.6 oz (76.5 kg)   BMI 29.87 kg/m  Gen: NAD, alert, cooperative with exam HEENT: NCAT, TMs normal bilaterally with slight layering of fluid on the right, oropharynx moist with erythematous soft palate, no clear tonsils enlarged, CV: RRR, good S1/S2, no murmur Resp: Nonlabored, good air movement, persistent rhonchi in the left lower lung field Ext: No edema, warm Neuro: Alert and oriented, No gross deficits  Assessment and plan:  # CAP Treat with azithromycin Also given cough syrup with codeine Discussed supportive care Return to clinic with any concerns     Meds ordered this encounter  Medications  . azithromycin (ZITHROMAX) 250 MG tablet    Sig: Take 2 tablets on day 1 and 1 tablet daily after that    Dispense:  6 tablet    Refill:  0  . guaiFENesin-codeine 100-10 MG/5ML syrup    Sig: Take 5 mLs by mouth 3 (three) times daily as needed for cough.    Dispense:  120 mL    Refill:  0    Murtis Sink, MD [place] [facility] 01/06/2017, 4:49 PM

## 2017-01-12 DIAGNOSIS — R079 Chest pain, unspecified: Secondary | ICD-10-CM | POA: Insufficient documentation

## 2017-01-12 NOTE — Progress Notes (Signed)
Cardiology Office Note:    Date:  01/13/2017   ID:  [name], DOB 1955/10/03, MRN [id number]  PCP:  Remus Loffler, PA-C  Cardiologist:  Dr. Excell Seltzer  Referring MD: Remus Loffler, PA-C   Chief Complaint  Patient presents with  . Follow-up    Testing    History of Present Illness:    Kristen Arroyo is a 61 y.o. female with a past medical history significant for arthritis, anxiety, GERD, hyperlipidemia, hypertension. The patient was referred to cardiology by Prudy Feeler, PA for screening of heart disease. The patient has family history significant for heart disease.  She has never been a smoker. No history of diabetes. No alcohol use.   The patient was referred to cardiology for screening of heart disease and consideration of her significant family history. I saw her on 12/09/16. She had been experiencing some occasional chest tightness and mild shortness of breath that was occurring about one to 3 times per month. An echocardiogram was done showing normal LV function without wall motion abnormalities. She also had a low risk exercise Myoview. I switched her from simvastatin to rosuvastatin with plans for follow up lipid panel in 3 months.  She was diagnosed with community-acquired pneumonia and placed on oral antibiotic on 01/06/17 per her PCP. She is still coughing green sputum and has just finished Zpack. She has had one brief episode of chest discomfort after work, not associated with any other symptoms since her last appt.    Past Medical History:  Diagnosis Date  . Anxiety   . Arthritis    knees  . GERD (gastroesophageal reflux disease)   . History of seasonal allergies   . Hyperlipidemia   . Hypertension     Past Surgical History:  Procedure Laterality Date  . CARPAL TUNNEL RELEASE  2006   right  . KNEE ARTHROSCOPY     left: torn meniscus    Current Medications: Current Meds  Medication Sig  . ALPRAZolam (XANAX) 0.5 MG tablet TAKE ONE TABLET BY MOUTH  ONCE DAILY AT BEDTIME AS NEEDED FOR SLEEP.  Marland Kitchen aspirin EC 81 MG tablet Take 1 tablet (81 mg total) by mouth daily.  Marland Kitchen buPROPion (WELLBUTRIN SR) 150 MG 12 hr tablet Take 1 tablet (150 mg total) by mouth daily.  . calcium-vitamin D (OSCAL WITH D) 500-200 MG-UNIT tablet Take 1 tablet by mouth daily.  . diclofenac (VOLTAREN) 75 MG EC tablet Take 1 tablet (75 mg total) by mouth 2 (two) times daily.  . DiphenhydrAMINE HCl (BENADRYL ALLERGY PO) Take 1 tablet by mouth as needed (ALLERGIES).   Marland Kitchen guaiFENesin-codeine 100-10 MG/5ML syrup Take 5 mLs by mouth 3 (three) times daily as needed for cough.  . hydrochlorothiazide (HYDRODIURIL) 25 MG tablet Take 1 tablet (25 mg total) by mouth daily.  Marland Kitchen lactase (LACTAID) 3000 units tablet Take 3,000 Units by mouth as needed (STOMACH UPSET).  Marland Kitchen lisinopril (PRINIVIL,ZESTRIL) 5 MG tablet TAKE 1 TABLET BY MOUTH ONCE DAILY  . loratadine (CLARITIN) 10 MG tablet Take 1 tablet (10 mg total) by mouth daily.  . Multiple Vitamin (MULTIVITAMIN) tablet Take 1 tablet by mouth daily.  Marland Kitchen omeprazole (PRILOSEC) 20 MG capsule Take 1 capsule (20 mg total) by mouth daily.  . rosuvastatin (CRESTOR) 20 MG tablet Take 1 tablet (20 mg total) by mouth daily.     Allergies:   Patient has no known allergies.   Social History   Social History  . Marital status: Married  Spouse name: N/A  . Number of children: 2  . Years of education: N/A   Occupational History  . Technical brewer   Social History Main Topics  . Smoking status: Never Smoker  . Smokeless tobacco: Never Used  . Alcohol use No  . Drug use: No  . Sexual activity: Not Asked   Other Topics Concern  . None   Social History Narrative  . None     Family History: The patient's family history includes CAD in her brother and brother; Diabetes in her brother; Heart failure in her mother; Hypertension in her brother, brother, brother, brother, brother, father, and mother; Multiple sclerosis in her brother; Peripheral  vascular disease in her mother; Stroke in her father. There is no history of Colon cancer. ROS:   Please see the history of present illness.     All other systems reviewed and are negative.  EKGs/Labs/Other Studies Reviewed:    The following studies were reviewed today:  Exercise Myoview 12/16/16 Study Highlights    Nuclear stress EF: 65%.  Blood pressure demonstrated a normal response to exercise.  Horizontal ST segment depression ST segment depression of 0.5 mm was noted during stress in the III and V6 leadsST deviation beginning in recovery.  This is a low risk study.  Findings consistent with ischemia.  Defect 1: There is a small defect of mild severity present in the basal inferoseptal and mid inferoseptal location.   Low risk stress nuclear study with a small area of inferoseptal ischemia and normal left ventricular regional and global systolic function. The inferior wall perfusion is normal. Consider a stenosis in a small posterior descending artery. Cannot exclude imaging artifact.   Echocardiogram 12/16/16 Study Conclusions - Left ventricle: The cavity size was normal. Wall thickness was   normal. Systolic function was normal. The estimated ejection   fraction was in the range of 60% to 65%. Wall motion was normal;   there were no regional wall motion abnormalities. Doppler   parameters are consistent with abnormal left ventricular   relaxation (grade 1 diastolic dysfunction). - Aortic valve: There was trivial regurgitation.  Impressions: - Normal LV systolic function; mild diastolic dysfunction; mildly   sclerotic aortic valve with trace AI.   EKG:  EKG is not ordered today.   Recent Labs: 03/04/2016: ALT 19; BUN 17; Creatinine, Ser 0.63; Hemoglobin 14.4; Platelets 238; Potassium 4.3; Sodium 142   Recent Lipid Panel    Component Value Date/Time   CHOL 203 (H) 03/04/2016 1309   TRIG 154 (H) 03/04/2016 1309   HDL 50 03/04/2016 1309   CHOLHDL 4.1  03/04/2016 1309   LDLCALC 122 (H) 03/04/2016 1309    Physical Exam:    VS:  BP (!) 138/92   Pulse 84   Ht  (1.6 m)   Wt 168 lb 12.8 oz (76.6 kg)   SpO2 95%   BMI 29.90 kg/m     Wt Readings from Last 3 Encounters:  01/13/17 168 lb 12.8 oz (76.6 kg)  01/06/17 168 lb 9.6 oz (76.5 kg)  12/16/16 169 lb (76.7 kg)     Physical Exam  Constitutional: She is oriented to person, place, and time. She appears well-developed and well-nourished. No distress.  HENT:  Head: Normocephalic and atraumatic.  Neck: Normal range of motion. Neck supple. No JVD present.  Cardiovascular: Normal rate, regular rhythm and normal heart sounds.  Exam reveals no gallop and no friction rub.   No murmur heard. Pulmonary/Chest: Effort normal. No respiratory  distress. She has no wheezes.  Breath sounds clear except Rhonchi in left posterior base. Pt with frequent cough.  Abdominal: Soft. Bowel sounds are normal.  Musculoskeletal: Normal range of motion. She exhibits no edema or deformity.  Neurological: She is alert and oriented to person, place, and time.  Skin: Skin is warm and dry.  Psychiatric: She has a normal mood and affect. Her behavior is normal. Thought content normal.     ASSESSMENT:    1. Essential hypertension   2. Chest pain, unspecified type   3. Mixed hyperlipidemia    PLAN:    In order of problems listed above:  1. Atypical Chest pain:   CVD risk factors include hypertension, hyperlipidemia and positive family history. This discomfort is very atypical without any associated symptoms. Echocardiogram was done showing normal LV function with mild grade 1 diastolic dysfunction and trivial AI, no regional wall motion abnormalities. He had a low risk Myoview on 12/16/16. We discussed her concerns about family history. She is advised to work on controlling modifiable risk factors with heart healthy diet, daily exercise, blood pressure control, and cholesterol control. She sees her PCP on a  regular basis. I will arrange routine annual cardiology follow-up. I have reviewed with the patient any concerning symptoms that she should return for earlier.  2. Hypertension:  Blood Pressures well controlled on hydrochlorothiazide and lisinopril.   3. Hyperlipidemia: Most recent lipid panel on 03/04/2016 showed LDL 122. Considering her strong family history, I have switched pt from simvastatin to high intensity statin at last visit. She will need follow up FLP and LFT in November/December. She wishes to have this done at her PCP and has a physical around that time. Instructions are given to have this done with results sent to out office.   4. Community acquired pneumonia: Pt being treated with antibiotics for PNA by her PCP. Still has cough productive of green sputum. No fever, chills. She still has rhonchi in her left lung base.  I advised her to rest and drink fluids and if not improved in 3-4 days or symptoms worsen to return to her PCP. She is scheduled to work for the next 3 days - 8 hours and does not feel that she can adequately recover while working. I will provide a note for 3 days out of work.    Medication Adjustments/Labs and Tests Ordered: Current medicines are reviewed at length with the patient today.  Concerns regarding medicines are outlined above. Labs and tests ordered and medication changes are outlined in the patient instructions below:  Patient Instructions  Medication Instructions:  Your physician recommends that you continue on your current medications as directed. Please refer to the Current Medication list given to you today.   Labwork: PLEASE HAVE FASTING LIPID AND LIVER PANEL DOE WITH YOUR PRIMARY CARE DOCTOR IN NOV OR DEC 2018; PLEASE HAVE THE RESULTS FAXED TO Berton Bon, NP [phone]   Testing/Procedures: NONE ORDERED TODAY  Follow-Up: Your physician wants you to follow-up in: 1 YEAR WITH DR. Theodoro Parma will receive a reminder letter in the mail two  months in advance. If you don't receive a letter, please call our office to schedule the follow-up appointment.   Any Other Special Instructions Will Be Listed Below (If Applicable).     If you need a refill on your cardiac medications before your next appointment, please call your pharmacy.      Signed, Berton Bon, NP  01/13/2017 8:56 AM    [facility]

## 2017-01-13 ENCOUNTER — Encounter (INDEPENDENT_AMBULATORY_CARE_PROVIDER_SITE_OTHER): Payer: Self-pay

## 2017-01-13 ENCOUNTER — Encounter: Payer: Self-pay | Admitting: Cardiology

## 2017-01-13 ENCOUNTER — Ambulatory Visit (INDEPENDENT_AMBULATORY_CARE_PROVIDER_SITE_OTHER): Payer: BLUE CROSS/BLUE SHIELD | Admitting: Cardiology

## 2017-01-13 VITALS — BP 138/92 | HR 84 | Ht 63.0 in | Wt 168.8 lb

## 2017-01-13 DIAGNOSIS — R0789 Other chest pain: Secondary | ICD-10-CM | POA: Diagnosis not present

## 2017-01-13 DIAGNOSIS — E782 Mixed hyperlipidemia: Secondary | ICD-10-CM | POA: Diagnosis not present

## 2017-01-13 DIAGNOSIS — I10 Essential (primary) hypertension: Secondary | ICD-10-CM

## 2017-01-13 NOTE — Patient Instructions (Signed)
Medication Instructions:  Your physician recommends that you continue on your current medications as directed. Please refer to the Current Medication list given to you today.   Labwork: PLEASE HAVE FASTING LIPID AND LIVER PANEL DOE WITH YOUR PRIMARY CARE DOCTOR IN NOV OR DEC 2018; PLEASE HAVE THE RESULTS FAXED TO Berton Bon, NP [phone]   Testing/Procedures: NONE ORDERED TODAY  Follow-Up: Your physician wants you to follow-up in: 1 YEAR WITH DR. Theodoro Parma will receive a reminder letter in the mail two months in advance. If you don't receive a letter, please call our office to schedule the follow-up appointment.   Any Other Special Instructions Will Be Listed Below (If Applicable).     If you need a refill on your cardiac medications before your next appointment, please call your pharmacy.

## 2017-03-04 ENCOUNTER — Encounter: Payer: Self-pay | Admitting: Physician Assistant

## 2017-03-04 ENCOUNTER — Ambulatory Visit (INDEPENDENT_AMBULATORY_CARE_PROVIDER_SITE_OTHER): Payer: BLUE CROSS/BLUE SHIELD | Admitting: Physician Assistant

## 2017-03-04 VITALS — BP 133/82 | HR 76 | Temp 99.0°F | Ht 63.0 in | Wt 167.0 lb

## 2017-03-04 DIAGNOSIS — R058 Other specified cough: Secondary | ICD-10-CM

## 2017-03-04 DIAGNOSIS — J181 Lobar pneumonia, unspecified organism: Secondary | ICD-10-CM

## 2017-03-04 DIAGNOSIS — R05 Cough: Secondary | ICD-10-CM

## 2017-03-04 DIAGNOSIS — J189 Pneumonia, unspecified organism: Secondary | ICD-10-CM | POA: Insufficient documentation

## 2017-03-04 MED ORDER — HYDROCOD POLST-CPM POLST ER 10-8 MG/5ML PO SUER: 5.0000 mL | Freq: Two times a day (BID) | ORAL | 0 refills | Status: DC | PRN

## 2017-03-04 MED ORDER — CEFDINIR 300 MG PO CAPS: 300.0000 mg | ORAL_CAPSULE | Freq: Two times a day (BID) | ORAL | 0 refills | Status: DC

## 2017-03-04 MED ORDER — METHYLPREDNISOLONE ACETATE 80 MG/ML IJ SUSP
80.0000 mg | Freq: Once | INTRAMUSCULAR | Status: AC
  Administered 2017-03-04: 80 mg via INTRAMUSCULAR

## 2017-03-04 MED ORDER — ALBUTEROL SULFATE HFA 108 (90 BASE) MCG/ACT IN AERS: 2.0000 | INHALATION_SPRAY | Freq: Four times a day (QID) | RESPIRATORY_TRACT | 0 refills | Status: DC | PRN

## 2017-03-04 NOTE — Patient Instructions (Signed)
In a few days you may receive a survey in the mail or online from [employer] regarding your visit with us today. Please take a moment to fill this out. Your feedback is very important to our whole office. It can help us better understand your needs as well as improve your experience and satisfaction. Thank you for taking your time to complete it. We care about you.  Gerilyn Stargell, PA-C  

## 2017-03-04 NOTE — Progress Notes (Addendum)
BP 133/82   Pulse 76   Temp 99 F (37.2 C) (Oral)   Ht 5\' 3"  (1.6 m)   Wt 167 lb (75.8 kg)   SpO2 97%   BMI 29.58 kg/m    Subjective:    Patient ID: Kristen Arroyo, female    DOB: Sep 28, 1955, 61 y.o.   MRN: [id number]  HPI: Kristen LibraDeborah B Astacio is a 61 y.o. female presenting on 03/04/2017 for Follow-up (Had pneumonia in October ); Nasal Congestion; and Cough  Patient with almost 2 months of progressing upper respiratory and bronchial symptoms. Initially there was more upper respiratory congestion. This progressed to having significant cough that is productive throughout the day and severe at night. There is wheezing after coughing. Sometimes there is slight dyspnea on exertion. It is productive mucus that is yellow in color. Denies any blood. Expresses extreme fatigue  Relevant past medical, surgical, family and social history reviewed and updated as indicated. Allergies and medications reviewed and updated.  Past Medical History:  Diagnosis Date  . Anxiety   . Arthritis    knees  . GERD (gastroesophageal reflux disease)   . History of seasonal allergies   . Hyperlipidemia   . Hypertension     Past Surgical History:  Procedure Laterality Date  . CARPAL TUNNEL RELEASE  2006   right  . KNEE ARTHROSCOPY     left: torn meniscus    Review of Systems  Constitutional: Positive for fatigue. Negative for activity change and fever.  HENT: Positive for congestion and postnasal drip.   Eyes: Negative.   Respiratory: Positive for cough, shortness of breath and wheezing.   Cardiovascular: Negative.  Negative for chest pain.  Gastrointestinal: Negative.  Negative for abdominal pain.  Endocrine: Negative.   Genitourinary: Negative.  Negative for dysuria.  Musculoskeletal: Negative.   Skin: Negative.   Neurological: Negative.     Allergies as of 03/04/2017   No Known Allergies     Medication List        Accurate as of 03/04/17 12:37 PM. Always use your most recent med  list.          albuterol 108 (90 Base) MCG/ACT inhaler Commonly known as:  PROVENTIL HFA;VENTOLIN HFA Inhale 2 puffs into the lungs every 6 (six) hours as needed for wheezing or shortness of breath.   ALPRAZolam 0.5 MG tablet Commonly known as:  XANAX TAKE ONE TABLET BY MOUTH ONCE DAILY AT BEDTIME AS NEEDED FOR SLEEP.   aspirin EC 81 MG tablet Take 1 tablet (81 mg total) by mouth daily.   BENADRYL ALLERGY PO Take 1 tablet by mouth as needed (ALLERGIES).   buPROPion 150 MG 12 hr tablet Commonly known as:  WELLBUTRIN SR Take 1 tablet (150 mg total) by mouth daily.   calcium-vitamin D 500-200 MG-UNIT tablet Commonly known as:  OSCAL WITH D Take 1 tablet by mouth daily.   cefdinir 300 MG capsule Commonly known as:  OMNICEF Take 1 capsule (300 mg total) by mouth 2 (two) times daily. 1 po BID   chlorpheniramine-HYDROcodone 10-8 MG/5ML Suer Commonly known as:  TUSSIONEX PENNKINETIC ER Take 5 mLs by mouth every 12 (twelve) hours as needed for cough.   diclofenac 75 MG EC tablet Commonly known as:  VOLTAREN Take 1 tablet (75 mg total) by mouth 2 (two) times daily.   guaiFENesin-codeine 100-10 MG/5ML syrup Take 5 mLs by mouth 3 (three) times daily as needed for cough.   hydrochlorothiazide 25 MG tablet Commonly known as:  HYDRODIURIL Take 1 tablet (25 mg total) by mouth daily.   lactase 3000 units tablet Commonly known as:  LACTAID Take 3,000 Units by mouth as needed (STOMACH UPSET).   lisinopril 5 MG tablet Commonly known as:  PRINIVIL,ZESTRIL TAKE 1 TABLET BY MOUTH ONCE DAILY   loratadine 10 MG tablet Commonly known as:  CLARITIN Take 1 tablet (10 mg total) by mouth daily.   multivitamin tablet Take 1 tablet by mouth daily.   omeprazole 20 MG capsule Commonly known as:  PRILOSEC Take 1 capsule (20 mg total) by mouth daily.   rosuvastatin 20 MG tablet Commonly known as:  CRESTOR Take 1 tablet (20 mg total) by mouth daily.          Objective:    BP  133/82   Pulse 76   Temp 99 F (37.2 C) (Oral)   Ht 5\' 3"  (1.6 m)   Wt 167 lb (75.8 kg)   SpO2 97%   BMI 29.58 kg/m   No Known Allergies  Physical Exam  Constitutional: She is oriented to person, place, and time. She appears well-developed and well-nourished.  HENT:  Head: Normocephalic and atraumatic.  Right Ear: There is drainage and tenderness.  Left Ear: There is drainage and tenderness.  Nose: Mucosal edema and rhinorrhea present. Right sinus exhibits no maxillary sinus tenderness and no frontal sinus tenderness. Left sinus exhibits no maxillary sinus tenderness and no frontal sinus tenderness.  Mouth/Throat: Oropharyngeal exudate and posterior oropharyngeal erythema present.  Eyes: Conjunctivae and EOM are normal. Pupils are equal, round, and reactive to light.  Neck: Normal range of motion. Neck supple.  Cardiovascular: Normal rate, regular rhythm, normal heart sounds and intact distal pulses.  Pulmonary/Chest: Effort normal. She has wheezes in the left middle field and the left lower field. She has rhonchi in the left middle field and the left lower field.      Abdominal: Soft. Bowel sounds are normal.  Neurological: She is alert and oriented to person, place, and time. She has normal reflexes.  Skin: Skin is warm and dry. No rash noted.  Psychiatric: She has a normal mood and affect. Her behavior is normal. Judgment and thought content normal.        Assessment & Plan:   1. Pneumonia of left lower lobe due to infectious organism (HCC) - methylPREDNISolone acetate (DEPO-MEDROL) injection 80 mg - albuterol (PROVENTIL HFA;VENTOLIN HFA) 108 (90 Base) MCG/ACT inhaler; Inhale 2 puffs into the lungs every 6 (six) hours as needed for wheezing or shortness of breath.  Dispense: 1 Inhaler; Refill: 0 - cefdinir (OMNICEF) 300 MG capsule; Take 1 capsule (300 mg total) by mouth 2 (two) times daily. 1 po BID  Dispense: 20 capsule; Refill: 0 - chlorpheniramine-HYDROcodone (TUSSIONEX  PENNKINETIC ER) 10-8 MG/5ML SUER; Take 5 mLs by mouth every 12 (twelve) hours as needed for cough.  Dispense: 240 mL; Refill: 0   Patient will be out of work 12/5 through 03/13/2017 due to the pneumonia.  [employer] disability forms will be coming to our office.   2. Cough productive of purulent sputum - chlorpheniramine-HYDROcodone (TUSSIONEX PENNKINETIC ER) 10-8 MG/5ML SUER; Take 5 mLs by mouth every 12 (twelve) hours as needed for cough.  Dispense: 240 mL; Refill: 0    Current Outpatient Medications:  .  ALPRAZolam (XANAX) 0.5 MG tablet, TAKE ONE TABLET BY MOUTH ONCE DAILY AT BEDTIME AS NEEDED FOR SLEEP., Disp: 30 tablet, Rfl: 5 .  aspirin EC 81 MG tablet, Take 1 tablet (81 mg total)  by mouth daily., Disp: , Rfl:  .  buPROPion (WELLBUTRIN SR) 150 MG 12 hr tablet, Take 1 tablet (150 mg total) by mouth daily., Disp: 90 tablet, Rfl: 3 .  calcium-vitamin D (OSCAL WITH D) 500-200 MG-UNIT tablet, Take 1 tablet by mouth daily., Disp: , Rfl:  .  diclofenac (VOLTAREN) 75 MG EC tablet, Take 1 tablet (75 mg total) by mouth 2 (two) times daily., Disp: 180 tablet, Rfl: 3 .  DiphenhydrAMINE HCl (BENADRYL ALLERGY PO), Take 1 tablet by mouth as needed (ALLERGIES). , Disp: , Rfl:  .  hydrochlorothiazide (HYDRODIURIL) 25 MG tablet, Take 1 tablet (25 mg total) by mouth daily., Disp: 90 tablet, Rfl: 3 .  lactase (LACTAID) 3000 units tablet, Take 3,000 Units by mouth as needed (STOMACH UPSET)., Disp: , Rfl:  .  lisinopril (PRINIVIL,ZESTRIL) 5 MG tablet, TAKE 1 TABLET BY MOUTH ONCE DAILY, Disp: 90 tablet, Rfl: 3 .  loratadine (CLARITIN) 10 MG tablet, Take 1 tablet (10 mg total) by mouth daily., Disp: 30 tablet, Rfl: 0 .  Multiple Vitamin (MULTIVITAMIN) tablet, Take 1 tablet by mouth daily., Disp: , Rfl:  .  omeprazole (PRILOSEC) 20 MG capsule, Take 1 capsule (20 mg total) by mouth daily., Disp: 90 capsule, Rfl: 3 .  rosuvastatin (CRESTOR) 20 MG tablet, Take 1 tablet (20 mg total) by mouth daily., Disp: 90 tablet,  Rfl: 3 .  albuterol (PROVENTIL HFA;VENTOLIN HFA) 108 (90 Base) MCG/ACT inhaler, Inhale 2 puffs into the lungs every 6 (six) hours as needed for wheezing or shortness of breath., Disp: 1 Inhaler, Rfl: 0 .  cefdinir (OMNICEF) 300 MG capsule, Take 1 capsule (300 mg total) by mouth 2 (two) times daily. 1 po BID, Disp: 20 capsule, Rfl: 0 .  chlorpheniramine-HYDROcodone (TUSSIONEX PENNKINETIC ER) 10-8 MG/5ML SUER, Take 5 mLs by mouth every 12 (twelve) hours as needed for cough., Disp: 240 mL, Rfl: 0 .  guaiFENesin-codeine 100-10 MG/5ML syrup, Take 5 mLs by mouth 3 (three) times daily as needed for cough. (Patient not taking: Reported on 03/04/2017), Disp: 120 mL, Rfl: 0  Current Facility-Administered Medications:  .  methylPREDNISolone acetate (DEPO-MEDROL) injection 80 mg, 80 mg, Intramuscular, Once, Shantay Sonn S, PA-C Continue all other maintenance medications as listed above.  Follow up plan: Return in about 1 week (around 03/11/2017) for recheck.  Educational handout given for survey  Remus Loffler PA-C [facility] [address]  [place], [place] [address] [phone]   03/04/2017, 12:37 PM

## 2017-03-13 ENCOUNTER — Other Ambulatory Visit: Payer: BLUE CROSS/BLUE SHIELD | Admitting: Physician Assistant

## 2017-03-13 ENCOUNTER — Telehealth: Payer: Self-pay | Admitting: Physician Assistant

## 2017-03-13 NOTE — Telephone Encounter (Signed)
That is okay.

## 2017-03-13 NOTE — Telephone Encounter (Signed)
Pt can not be seen until 1/4 with jones due to no availability, but pt needs a note being released to go back to work. Please advise.

## 2017-03-13 NOTE — Telephone Encounter (Signed)
yes

## 2017-03-13 NOTE — Telephone Encounter (Signed)
Is it ok to work her in before 01/04 for rck

## 2017-03-16 ENCOUNTER — Encounter: Payer: Self-pay | Admitting: Physician Assistant

## 2017-03-16 ENCOUNTER — Ambulatory Visit (INDEPENDENT_AMBULATORY_CARE_PROVIDER_SITE_OTHER): Payer: BLUE CROSS/BLUE SHIELD

## 2017-03-16 ENCOUNTER — Ambulatory Visit (INDEPENDENT_AMBULATORY_CARE_PROVIDER_SITE_OTHER): Payer: BLUE CROSS/BLUE SHIELD | Admitting: Physician Assistant

## 2017-03-16 VITALS — BP 120/83 | HR 74 | Temp 98.6°F | Ht 63.0 in | Wt 165.4 lb

## 2017-03-16 DIAGNOSIS — J181 Lobar pneumonia, unspecified organism: Secondary | ICD-10-CM | POA: Diagnosis not present

## 2017-03-16 DIAGNOSIS — J189 Pneumonia, unspecified organism: Secondary | ICD-10-CM

## 2017-03-16 MED ORDER — CEFTRIAXONE SODIUM 1 G IJ SOLR
1.0000 g | Freq: Once | INTRAMUSCULAR | Status: AC
  Administered 2017-03-16: 1 g via INTRAMUSCULAR

## 2017-03-16 NOTE — Patient Instructions (Signed)
In a few days you may receive a survey in the mail or online from [employer] regarding your visit with us today. Please take a moment to fill this out. Your feedback is very important to our whole office. It can help us better understand your needs as well as improve your experience and satisfaction. Thank you for taking your time to complete it. We care about you.  Mattison Golay, PA-C  

## 2017-03-16 NOTE — Telephone Encounter (Signed)
Appointment scheduled for patient

## 2017-03-16 NOTE — Progress Notes (Signed)
Claim number [employer] [id number]    BP 120/83   Pulse 74   Temp 98.6 F (37 C) (Oral)   Ht 5\' 3"  (1.6 m)   Wt 165 lb 6.4 oz (75 kg)   BMI 29.30 kg/m    Subjective:    Patient ID: Kristen Arroyo, female    DOB: 03-14-1956, 61 y.o.   MRN: [id number]  HPI: Kristen Arroyo is a 61 y.o. female presenting on 03/16/2017 for Follow-up (pneumonia)  Claim number [employer] [id number]  The patient is here for recheck on her pneumonia.  She is under short-term disability with [employer].  The claim number is listed above.  She states she has had a decrease in the amount of cough.  She still has some productive cough and there is slight green color.  She has not been running a fever anymore.  She is using the inhaler on a regular basis and does seem to notice an improvement in her overall breathing.  We have discussed that she can return to work.  I told her she will be more tired for some time.  She may have coughing for many weeks to come.  Please keep the inhaler around as much as possible.  Relevant past medical, surgical, family and social history reviewed and updated as indicated. Allergies and medications reviewed and updated.  Past Medical History:  Diagnosis Date  . Anxiety   . Arthritis    knees  . GERD (gastroesophageal reflux disease)   . History of seasonal allergies   . Hyperlipidemia   . Hypertension     Past Surgical History:  Procedure Laterality Date  . CARPAL TUNNEL RELEASE  2006   right  . KNEE ARTHROSCOPY     left: torn meniscus    Review of Systems  Constitutional: Positive for fatigue. Negative for activity change and fever.  HENT: Negative.   Eyes: Negative.   Respiratory: Positive for cough and shortness of breath. Negative for wheezing.   Cardiovascular: Negative.  Negative for chest pain.  Gastrointestinal: Negative.  Negative for abdominal pain.  Endocrine: Negative.   Genitourinary: Negative.  Negative for dysuria.  Musculoskeletal:  Negative.   Skin: Negative.   Neurological: Negative.     Allergies as of 03/16/2017   No Known Allergies     Medication List        Accurate as of 03/16/17 10:43 AM. Always use your most recent med list.          albuterol 108 (90 Base) MCG/ACT inhaler Commonly known as:  PROVENTIL HFA;VENTOLIN HFA Inhale 2 puffs into the lungs every 6 (six) hours as needed for wheezing or shortness of breath.   ALPRAZolam 0.5 MG tablet Commonly known as:  XANAX TAKE ONE TABLET BY MOUTH ONCE DAILY AT BEDTIME AS NEEDED FOR SLEEP.   aspirin EC 81 MG tablet Take 1 tablet (81 mg total) by mouth daily.   BENADRYL ALLERGY PO Take 1 tablet by mouth as needed (ALLERGIES).   buPROPion 150 MG 12 hr tablet Commonly known as:  WELLBUTRIN SR Take 1 tablet (150 mg total) by mouth daily.   calcium-vitamin D 500-200 MG-UNIT tablet Commonly known as:  OSCAL WITH D Take 1 tablet by mouth daily.   chlorpheniramine-HYDROcodone 10-8 MG/5ML Suer Commonly known as:  TUSSIONEX PENNKINETIC ER Take 5 mLs by mouth every 12 (twelve) hours as needed for cough.   diclofenac 75 MG EC tablet Commonly known as:  VOLTAREN Take 1 tablet (75 mg total) by mouth 2 (  two) times daily.   hydrochlorothiazide 25 MG tablet Commonly known as:  HYDRODIURIL Take 1 tablet (25 mg total) by mouth daily.   lactase 3000 units tablet Commonly known as:  LACTAID Take 3,000 Units by mouth as needed (STOMACH UPSET).   lisinopril 5 MG tablet Commonly known as:  PRINIVIL,ZESTRIL TAKE 1 TABLET BY MOUTH ONCE DAILY   loratadine 10 MG tablet Commonly known as:  CLARITIN Take 1 tablet (10 mg total) by mouth daily.   multivitamin tablet Take 1 tablet by mouth daily.   omeprazole 20 MG capsule Commonly known as:  PRILOSEC Take 1 capsule (20 mg total) by mouth daily.   rosuvastatin 20 MG tablet Commonly known as:  CRESTOR Take 1 tablet (20 mg total) by mouth daily.          Objective:    BP 120/83   Pulse 74   Temp  98.6 F (37 C) (Oral)   Ht 5\' 3"  (1.6 m)   Wt 165 lb 6.4 oz (75 kg)   BMI 29.30 kg/m   No Known Allergies  Physical Exam  Constitutional: She is oriented to person, place, and time. She appears well-developed and well-nourished.  HENT:  Head: Normocephalic and atraumatic.  Right Ear: Tympanic membrane, external ear and ear canal normal.  Left Ear: Tympanic membrane, external ear and ear canal normal.  Nose: Nose normal. No rhinorrhea.  Mouth/Throat: Oropharynx is clear and moist and mucous membranes are normal. No oropharyngeal exudate or posterior oropharyngeal erythema.  Eyes: Conjunctivae and EOM are normal. Pupils are equal, round, and reactive to light.  Neck: Normal range of motion. Neck supple.  Cardiovascular: Normal rate, regular rhythm, normal heart sounds and intact distal pulses.  Pulmonary/Chest: Effort normal. She has rhonchi in the left middle field and the left lower field.      The overall decreased breath sounds and wheezing in the right lower lobe are diminished.  They are still present.  The rest of the lung fields appear normal.  Abdominal: Soft. Bowel sounds are normal.  Neurological: She is alert and oriented to person, place, and time. She has normal reflexes.  Skin: Skin is warm and dry. No rash noted.  Psychiatric: She has a normal mood and affect. Her behavior is normal. Judgment and thought content normal.        Assessment & Plan:   1. Pneumonia of left lower lobe due to infectious organism [facility](HCC) - DG Chest 2 View; Future - cefTRIAXone (ROCEPHIN) injection 1 g Release return to work tomorrow 03/17/2017.  She will have decreased endurance for a while but she can return to work at this time.   Current Outpatient Medications:  .  albuterol (PROVENTIL HFA;VENTOLIN HFA) 108 (90 Base) MCG/ACT inhaler, Inhale 2 puffs into the lungs every 6 (six) hours as needed for wheezing or shortness of breath., Disp: 1 Inhaler, Rfl: 0 .  ALPRAZolam (XANAX) 0.5 MG  tablet, TAKE ONE TABLET BY MOUTH ONCE DAILY AT BEDTIME AS NEEDED FOR SLEEP., Disp: 30 tablet, Rfl: 5 .  aspirin EC 81 MG tablet, Take 1 tablet (81 mg total) by mouth daily., Disp: , Rfl:  .  buPROPion (WELLBUTRIN SR) 150 MG 12 hr tablet, Take 1 tablet (150 mg total) by mouth daily., Disp: 90 tablet, Rfl: 3 .  calcium-vitamin D (OSCAL WITH D) 500-200 MG-UNIT tablet, Take 1 tablet by mouth daily., Disp: , Rfl:  .  chlorpheniramine-HYDROcodone (TUSSIONEX PENNKINETIC ER) 10-8 MG/5ML SUER, Take 5 mLs by mouth every 12 (twelve)  hours as needed for cough., Disp: 240 mL, Rfl: 0 .  diclofenac (VOLTAREN) 75 MG EC tablet, Take 1 tablet (75 mg total) by mouth 2 (two) times daily., Disp: 180 tablet, Rfl: 3 .  DiphenhydrAMINE HCl (BENADRYL ALLERGY PO), Take 1 tablet by mouth as needed (ALLERGIES). , Disp: , Rfl:  .  hydrochlorothiazide (HYDRODIURIL) 25 MG tablet, Take 1 tablet (25 mg total) by mouth daily., Disp: 90 tablet, Rfl: 3 .  lactase (LACTAID) 3000 units tablet, Take 3,000 Units by mouth as needed (STOMACH UPSET)., Disp: , Rfl:  .  lisinopril (PRINIVIL,ZESTRIL) 5 MG tablet, TAKE 1 TABLET BY MOUTH ONCE DAILY, Disp: 90 tablet, Rfl: 3 .  loratadine (CLARITIN) 10 MG tablet, Take 1 tablet (10 mg total) by mouth daily., Disp: 30 tablet, Rfl: 0 .  Multiple Vitamin (MULTIVITAMIN) tablet, Take 1 tablet by mouth daily., Disp: , Rfl:  .  omeprazole (PRILOSEC) 20 MG capsule, Take 1 capsule (20 mg total) by mouth daily., Disp: 90 capsule, Rfl: 3 .  rosuvastatin (CRESTOR) 20 MG tablet, Take 1 tablet (20 mg total) by mouth daily., Disp: 90 tablet, Rfl: 3 Continue all other maintenance medications as listed above.  Follow up plan: Return if symptoms worsen or fail to improve.  Educational handout given for survey  Remus LofflerAngel S. Doni Widmer PA-C [facility] [address]  [place], [place] [address] [phone]   03/16/2017, 10:43 AM

## 2017-03-18 ENCOUNTER — Ambulatory Visit: Payer: BLUE CROSS/BLUE SHIELD | Admitting: Physician Assistant

## 2017-03-19 ENCOUNTER — Telehealth: Payer: Self-pay | Admitting: Physician Assistant

## 2017-03-19 ENCOUNTER — Other Ambulatory Visit: Payer: Self-pay | Admitting: Physician Assistant

## 2017-03-19 DIAGNOSIS — M25549 Pain in joints of unspecified hand: Secondary | ICD-10-CM

## 2017-03-19 DIAGNOSIS — F3342 Major depressive disorder, recurrent, in full remission: Secondary | ICD-10-CM

## 2017-03-19 NOTE — Telephone Encounter (Signed)
That is okay.

## 2017-03-19 NOTE — Telephone Encounter (Signed)
Patient aware and note wrote.

## 2017-03-20 ENCOUNTER — Telehealth: Payer: Self-pay | Admitting: Physician Assistant

## 2017-03-20 NOTE — Telephone Encounter (Signed)
Patient just wanting to let us know that her FMLA continuation papers will be faxed over today.

## 2017-04-02 NOTE — Telephone Encounter (Signed)
Returned call, FMLA papers not received yet, patient to call [employer], there may have been an issue while we were closed the holidays

## 2017-04-03 ENCOUNTER — Encounter: Payer: Self-pay | Admitting: Physician Assistant

## 2017-04-03 ENCOUNTER — Ambulatory Visit (INDEPENDENT_AMBULATORY_CARE_PROVIDER_SITE_OTHER): Payer: BLUE CROSS/BLUE SHIELD | Admitting: Physician Assistant

## 2017-04-03 VITALS — BP 121/76 | HR 79 | Temp 98.6°F | Ht 63.0 in | Wt 164.6 lb

## 2017-04-03 DIAGNOSIS — K219 Gastro-esophageal reflux disease without esophagitis: Secondary | ICD-10-CM

## 2017-04-03 DIAGNOSIS — J3089 Other allergic rhinitis: Secondary | ICD-10-CM

## 2017-04-03 DIAGNOSIS — M25549 Pain in joints of unspecified hand: Secondary | ICD-10-CM

## 2017-04-03 DIAGNOSIS — J0110 Acute frontal sinusitis, unspecified: Secondary | ICD-10-CM

## 2017-04-03 DIAGNOSIS — J181 Lobar pneumonia, unspecified organism: Secondary | ICD-10-CM

## 2017-04-03 DIAGNOSIS — J189 Pneumonia, unspecified organism: Secondary | ICD-10-CM

## 2017-04-03 DIAGNOSIS — F3342 Major depressive disorder, recurrent, in full remission: Secondary | ICD-10-CM

## 2017-04-03 DIAGNOSIS — Z01419 Encounter for gynecological examination (general) (routine) without abnormal findings: Secondary | ICD-10-CM | POA: Diagnosis not present

## 2017-04-03 DIAGNOSIS — R062 Wheezing: Secondary | ICD-10-CM

## 2017-04-03 DIAGNOSIS — I10 Essential (primary) hypertension: Secondary | ICD-10-CM

## 2017-04-03 MED ORDER — LORATADINE 10 MG PO TABS: 10.0000 mg | ORAL_TABLET | Freq: Every day | ORAL | 3 refills | Status: AC

## 2017-04-03 MED ORDER — DICLOFENAC SODIUM 75 MG PO TBEC: 75.0000 mg | DELAYED_RELEASE_TABLET | Freq: Two times a day (BID) | ORAL | 3 refills | Status: DC

## 2017-04-03 MED ORDER — OMEPRAZOLE 20 MG PO CPDR: 20.0000 mg | DELAYED_RELEASE_CAPSULE | Freq: Every day | ORAL | 3 refills | Status: DC

## 2017-04-03 MED ORDER — AZITHROMYCIN 250 MG PO TABS: ORAL_TABLET | ORAL | 0 refills | Status: DC

## 2017-04-03 MED ORDER — BUPROPION HCL ER (SR) 150 MG PO TB12: 150.0000 mg | ORAL_TABLET | Freq: Every day | ORAL | 3 refills | Status: DC

## 2017-04-03 MED ORDER — HYDROCHLOROTHIAZIDE 25 MG PO TABS: 25.0000 mg | ORAL_TABLET | Freq: Every day | ORAL | 3 refills | Status: DC

## 2017-04-03 MED ORDER — TERBINAFINE HCL 250 MG PO TABS: 250.0000 mg | ORAL_TABLET | Freq: Every day | ORAL | 5 refills | Status: DC

## 2017-04-03 NOTE — Progress Notes (Signed)
Consider intermit leave through med leave at work  [employer]  Returning to work 04/06/17    BP 121/76   Pulse 79   Temp 98.6 F (37 C) (Oral)   Ht '5\' 3"'  (1.6 m)   Wt 164 lb 9.6 oz (74.7 kg)   BMI 29.16 kg/m    Subjective:    Patient ID: Kristen Arroyo, female    DOB: 1955-12-22, 62 y.o.   MRN: [id number]  HPI: Kristen Arroyo is a 62 y.o. female presenting on 04/03/2017 for Annual Exam  This patient comes in for annual well physical examination. All medications are reviewed today. There are no reports of any problems with the medications. All of the medical conditions are reviewed and updated.  Lab work is reviewed and will be ordered as medically necessary. There are no new problems reported with today's visit.  Patient reports doing well overall.  Patient with several days of progressing upper respiratory and bronchial symptoms. Initially there was more upper respiratory congestion. This progressed to having significant cough that is productive throughout the day and severe at night. There is occasional wheezing after coughing. Sometimes there is slight dyspnea on exertion. It is productive mucus that is yellow in color. Denies any blood.  Relevant past medical, surgical, family and social history reviewed and updated as indicated. Allergies and medications reviewed and updated.  Past Medical History:  Diagnosis Date  . Anxiety   . Arthritis    knees  . GERD (gastroesophageal reflux disease)   . History of seasonal allergies   . Hyperlipidemia   . Hypertension     Past Surgical History:  Procedure Laterality Date  . CARPAL TUNNEL RELEASE  2006   right  . KNEE ARTHROSCOPY     left: torn meniscus    Review of Systems  Constitutional: Negative.  Negative for activity change, fatigue and fever.  HENT: Negative.   Eyes: Negative.   Respiratory: Negative.  Negative for cough.   Cardiovascular: Negative.  Negative for chest pain.  Gastrointestinal: Negative.   Negative for abdominal pain.  Endocrine: Negative.   Genitourinary: Negative.  Negative for dysuria.  Musculoskeletal: Negative.   Skin: Negative.   Neurological: Negative.     Allergies as of 04/03/2017   No Known Allergies     Medication List        Accurate as of 04/03/17 11:59 PM. Always use your most recent med list.          albuterol 108 (90 Base) MCG/ACT inhaler Commonly known as:  PROVENTIL HFA;VENTOLIN HFA Inhale 2 puffs into the lungs every 6 (six) hours as needed for wheezing or shortness of breath.   ALPRAZolam 0.5 MG tablet Commonly known as:  XANAX TAKE ONE TABLET BY MOUTH ONCE DAILY AT BEDTIME AS NEEDED FOR SLEEP.   aspirin EC 81 MG tablet Take 1 tablet (81 mg total) by mouth daily.   azithromycin 250 MG tablet Commonly known as:  ZITHROMAX Take as directed   BENADRYL ALLERGY PO Take 1 tablet by mouth as needed (ALLERGIES).   buPROPion 150 MG 12 hr tablet Commonly known as:  WELLBUTRIN SR Take 1 tablet (150 mg total) by mouth daily.   calcium-vitamin D 500-200 MG-UNIT tablet Commonly known as:  OSCAL WITH D Take 1 tablet by mouth daily.   chlorpheniramine-HYDROcodone 10-8 MG/5ML Suer Commonly known as:  TUSSIONEX PENNKINETIC ER Take 5 mLs by mouth every 12 (twelve) hours as needed for cough.   diclofenac 75 MG EC tablet Commonly  known as:  VOLTAREN Take 1 tablet (75 mg total) by mouth 2 (two) times daily.   hydrochlorothiazide 25 MG tablet Commonly known as:  HYDRODIURIL Take 1 tablet (25 mg total) by mouth daily.   lactase 3000 units tablet Commonly known as:  LACTAID Take 3,000 Units by mouth as needed (STOMACH UPSET).   lisinopril 5 MG tablet Commonly known as:  PRINIVIL,ZESTRIL TAKE 1 TABLET BY MOUTH ONCE DAILY   loratadine 10 MG tablet Commonly known as:  CLARITIN Take 1 tablet (10 mg total) by mouth daily.   multivitamin tablet Take 1 tablet by mouth daily.   omeprazole 20 MG capsule Commonly known as:  PRILOSEC Take 1  capsule (20 mg total) by mouth daily.   rosuvastatin 20 MG tablet Commonly known as:  CRESTOR Take 1 tablet (20 mg total) by mouth daily.   terbinafine 250 MG tablet Commonly known as:  LAMISIL Take 1 tablet (250 mg total) by mouth daily.          Objective:    BP 121/76   Pulse 79   Temp 98.6 F (37 C) (Oral)   Ht '5\' 3"'  (1.6 m)   Wt 164 lb 9.6 oz (74.7 kg)   BMI 29.16 kg/m   No Known Allergies  Physical Exam  Constitutional: She is oriented to person, place, and time. She appears well-developed and well-nourished.  HENT:  Head: Normocephalic and atraumatic.  Eyes: Conjunctivae and EOM are normal. Pupils are equal, round, and reactive to light.  Neck: Normal range of motion. Neck supple.  Cardiovascular: Normal rate, regular rhythm, normal heart sounds and intact distal pulses.  Pulmonary/Chest: Effort normal. She has no decreased breath sounds. She has wheezes in the left middle field and the left lower field.     Right breast exhibits no mass, no skin change and no tenderness. Left breast exhibits no mass, no skin change and no tenderness. Breasts are symmetrical.  Abdominal: Soft. Bowel sounds are normal.  Genitourinary: Vagina normal and uterus normal. Rectal exam shows no fissure. No breast swelling, tenderness, discharge or bleeding. There is no tenderness or lesion on the right labia. There is no tenderness or lesion on the left labia. Uterus is not deviated, not enlarged and not tender. Cervix exhibits no motion tenderness, no discharge and no friability. Right adnexum displays no mass, no tenderness and no fullness. Left adnexum displays no mass, no tenderness and no fullness. No tenderness or bleeding in the vagina. No vaginal discharge found.  Neurological: She is alert and oriented to person, place, and time. She has normal reflexes.  Skin: Skin is warm and dry. No rash noted.  Psychiatric: She has a normal mood and affect. Her behavior is normal. Judgment and  thought content normal.  Nursing note and vitals reviewed.   Results for orders placed or performed in visit on 04/03/17  CBC with Differential/Platelet  Result Value Ref Range   WBC 7.3 3.4 - 10.8 x10E3/uL   RBC 4.66 3.77 - 5.28 x10E6/uL   Hemoglobin 14.7 11.1 - 15.9 g/dL   Hematocrit 41.4 34.0 - 46.6 %   MCV 89 79 - 97 fL   MCH 31.5 26.6 - 33.0 pg   MCHC 35.5 31.5 - 35.7 g/dL   RDW 13.0 12.3 - 15.4 %   Platelets 231 150 - 379 x10E3/uL   Neutrophils 80 Not Estab. %   Lymphs 13 Not Estab. %   Monocytes 5 Not Estab. %   Eos 2 Not Estab. %  Basos 0 Not Estab. %   Neutrophils Absolute 5.8 1.4 - 7.0 x10E3/uL   Lymphocytes Absolute 1.0 0.7 - 3.1 x10E3/uL   Monocytes Absolute 0.4 0.1 - 0.9 x10E3/uL   EOS (ABSOLUTE) 0.1 0.0 - 0.4 x10E3/uL   Basophils Absolute 0.0 0.0 - 0.2 x10E3/uL   Immature Granulocytes 0 Not Estab. %   Immature Grans (Abs) 0.0 0.0 - 0.1 x10E3/uL  CMP14+EGFR  Result Value Ref Range   Glucose 87 65 - 99 mg/dL   BUN 15 8 - 27 mg/dL   Creatinine, Ser 0.82 0.57 - 1.00 mg/dL   GFR calc non Af Amer 78 >59 mL/min/1.73   GFR calc Af Amer 90 >59 mL/min/1.73   BUN/Creatinine Ratio 18 12 - 28   Sodium 143 134 - 144 mmol/L   Potassium 4.0 3.5 - 5.2 mmol/L   Chloride 103 96 - 106 mmol/L   CO2 24 20 - 29 mmol/L   Calcium 9.8 8.7 - 10.3 mg/dL   Total Protein 7.5 6.0 - 8.5 g/dL   Albumin 4.9 (H) 3.6 - 4.8 g/dL   Globulin, Total 2.6 1.5 - 4.5 g/dL   Albumin/Globulin Ratio 1.9 1.2 - 2.2   Bilirubin Total 0.5 0.0 - 1.2 mg/dL   Alkaline Phosphatase 91 39 - 117 IU/L   AST 17 0 - 40 IU/L   ALT 19 0 - 32 IU/L  Lipid panel  Result Value Ref Range   Cholesterol, Total 189 100 - 199 mg/dL   Triglycerides 148 0 - 149 mg/dL   HDL 52 >39 mg/dL   VLDL Cholesterol Cal 30 5 - 40 mg/dL   LDL Calculated 107 (H) 0 - 99 mg/dL   Chol/HDL Ratio 3.6 0.0 - 4.4 ratio  TSH  Result Value Ref Range   TSH 1.140 0.450 - 4.500 uIU/mL  IGP, Aptima HPV, rfx 16/18,45  Result Value Ref Range     DIAGNOSIS: WILL FOLLOW    PAP NOTE: WILL FOLLOW    Note: WILL FOLLOW    HPV Aptima Negative Negative      Assessment & Plan:   1. Well woman exam - CBC with Differential/Platelet - CMP14+EGFR - Lipid panel - TSH - IGP, Aptima HPV, rfx 16/18,45  2. Wheezing - Ambulatory referral to Pulmonology  3. Acute non-recurrent frontal sinusitis  4. Gastroesophageal reflux disease without esophagitis - omeprazole (PRILOSEC) 20 MG capsule; Take 1 capsule (20 mg total) by mouth daily.  Dispense: 90 capsule; Refill: 3  5. Chronic nonseasonal allergic rhinitis due to pollen - loratadine (CLARITIN) 10 MG tablet; Take 1 tablet (10 mg total) by mouth daily.  Dispense: 90 tablet; Refill: 3  6. Essential hypertension - hydrochlorothiazide (HYDRODIURIL) 25 MG tablet; Take 1 tablet (25 mg total) by mouth daily.  Dispense: 90 tablet; Refill: 3  7. Arthralgia of hand, unspecified laterality - diclofenac (VOLTAREN) 75 MG EC tablet; Take 1 tablet (75 mg total) by mouth 2 (two) times daily.  Dispense: 180 tablet; Refill: 3  8. Recurrent major depressive disorder, in full remission ([place]) - buPROPion (WELLBUTRIN SR) 150 MG 12 hr tablet; Take 1 tablet (150 mg total) by mouth daily.  Dispense: 90 tablet; Refill: 3  9. Pneumonia of left lower lobe due to infectious organism [facility]) Consider intermit leave through med leave at work  [employer]  Returning to work 04/06/17   Current Outpatient Medications:  .  albuterol (PROVENTIL HFA;VENTOLIN HFA) 108 (90 Base) MCG/ACT inhaler, Inhale 2 puffs into the lungs every 6 (six) hours as needed for  wheezing or shortness of breath., Disp: 1 Inhaler, Rfl: 0 .  ALPRAZolam (XANAX) 0.5 MG tablet, TAKE ONE TABLET BY MOUTH ONCE DAILY AT BEDTIME AS NEEDED FOR SLEEP., Disp: 30 tablet, Rfl: 5 .  aspirin EC 81 MG tablet, Take 1 tablet (81 mg total) by mouth daily., Disp: , Rfl:  .  buPROPion (WELLBUTRIN SR) 150 MG 12 hr tablet, Take 1 tablet (150 mg total) by mouth daily.,  Disp: 90 tablet, Rfl: 3 .  calcium-vitamin D (OSCAL WITH D) 500-200 MG-UNIT tablet, Take 1 tablet by mouth daily., Disp: , Rfl:  .  chlorpheniramine-HYDROcodone (TUSSIONEX PENNKINETIC ER) 10-8 MG/5ML SUER, Take 5 mLs by mouth every 12 (twelve) hours as needed for cough., Disp: 240 mL, Rfl: 0 .  diclofenac (VOLTAREN) 75 MG EC tablet, Take 1 tablet (75 mg total) by mouth 2 (two) times daily., Disp: 180 tablet, Rfl: 3 .  DiphenhydrAMINE HCl (BENADRYL ALLERGY PO), Take 1 tablet by mouth as needed (ALLERGIES). , Disp: , Rfl:  .  hydrochlorothiazide (HYDRODIURIL) 25 MG tablet, Take 1 tablet (25 mg total) by mouth daily., Disp: 90 tablet, Rfl: 3 .  lactase (LACTAID) 3000 units tablet, Take 3,000 Units by mouth as needed (STOMACH UPSET)., Disp: , Rfl:  .  lisinopril (PRINIVIL,ZESTRIL) 5 MG tablet, TAKE 1 TABLET BY MOUTH ONCE DAILY, Disp: 90 tablet, Rfl: 3 .  loratadine (CLARITIN) 10 MG tablet, Take 1 tablet (10 mg total) by mouth daily., Disp: 90 tablet, Rfl: 3 .  Multiple Vitamin (MULTIVITAMIN) tablet, Take 1 tablet by mouth daily., Disp: , Rfl:  .  omeprazole (PRILOSEC) 20 MG capsule, Take 1 capsule (20 mg total) by mouth daily., Disp: 90 capsule, Rfl: 3 .  rosuvastatin (CRESTOR) 20 MG tablet, Take 1 tablet (20 mg total) by mouth daily., Disp: 90 tablet, Rfl: 3 .  azithromycin (ZITHROMAX) 250 MG tablet, Take as directed, Disp: 6 tablet, Rfl: 0 .  terbinafine (LAMISIL) 250 MG tablet, Take 1 tablet (250 mg total) by mouth daily., Disp: 30 tablet, Rfl: 5 Continue all other maintenance medications as listed above.  Follow up plan: No Follow-up on file.  Educational handout given for [place] PA-C [place] [address]  [place], [place] [address] [phone]   04/06/2017, 7:58 AM

## 2017-04-04 LAB — LIPID PANEL
CHOLESTEROL TOTAL: 189 mg/dL (ref 100–199)
Chol/HDL Ratio: 3.6 ratio (ref 0.0–4.4)
HDL: 52 mg/dL (ref >39)
LDL Calculated: 107 mg/dL — ABNORMAL HIGH (ref 0–99)
Triglycerides: 148 mg/dL (ref 0–149)
VLDL Cholesterol Cal: 30 mg/dL (ref 5–40)

## 2017-04-04 LAB — CBC WITH DIFFERENTIAL/PLATELET
BASOS ABS: 0 10*3/uL (ref 0.0–0.2)
Basos: 0 %
EOS (ABSOLUTE): 0.1 10*3/uL (ref 0.0–0.4)
EOS: 2 %
HEMATOCRIT: 41.4 % (ref 34.0–46.6)
HEMOGLOBIN: 14.7 g/dL (ref 11.1–15.9)
IMMATURE GRANS (ABS): 0 10*3/uL (ref 0.0–0.1)
Immature Granulocytes: 0 %
LYMPHS: 13 %
Lymphocytes Absolute: 1 10*3/uL (ref 0.7–3.1)
MCH: 31.5 pg (ref 26.6–33.0)
MCHC: 35.5 g/dL (ref 31.5–35.7)
MCV: 89 fL (ref 79–97)
MONOCYTES: 5 %
Monocytes Absolute: 0.4 10*3/uL (ref 0.1–0.9)
Neutrophils Absolute: 5.8 10*3/uL (ref 1.4–7.0)
Neutrophils: 80 %
Platelets: 231 10*3/uL (ref 150–379)
RBC: 4.66 x10E6/uL (ref 3.77–5.28)
RDW: 13 % (ref 12.3–15.4)
WBC: 7.3 10*3/uL (ref 3.4–10.8)

## 2017-04-04 LAB — CMP14+EGFR
ALBUMIN: 4.9 g/dL — AB (ref 3.6–4.8)
ALT: 19 IU/L (ref 0–32)
AST: 17 IU/L (ref 0–40)
Albumin/Globulin Ratio: 1.9 (ref 1.2–2.2)
Alkaline Phosphatase: 91 IU/L (ref 39–117)
BUN / CREAT RATIO: 18 (ref 12–28)
BUN: 15 mg/dL (ref 8–27)
Bilirubin Total: 0.5 mg/dL (ref 0.0–1.2)
CO2: 24 mmol/L (ref 20–29)
CREATININE: 0.82 mg/dL (ref 0.57–1.00)
Calcium: 9.8 mg/dL (ref 8.7–10.3)
Chloride: 103 mmol/L (ref 96–106)
GFR calc non Af Amer: 78 mL/min/{1.73_m2} (ref >59)
GFR, EST AFRICAN AMERICAN: 90 mL/min/{1.73_m2} (ref >59)
GLOBULIN, TOTAL: 2.6 g/dL (ref 1.5–4.5)
GLUCOSE: 87 mg/dL (ref 65–99)
Potassium: 4 mmol/L (ref 3.5–5.2)
SODIUM: 143 mmol/L (ref 134–144)
TOTAL PROTEIN: 7.5 g/dL (ref 6.0–8.5)

## 2017-04-04 LAB — TSH: TSH: 1.14 u[IU]/mL (ref 0.450–4.500)

## 2017-04-06 LAB — IGP, APTIMA HPV, RFX 16/18,45
HPV Aptima: NEGATIVE
PAP Smear Comment: 0

## 2017-04-06 NOTE — Patient Instructions (Signed)
In a few days you may receive a survey in the mail or online from [employer] regarding your visit with us today. Please take a moment to fill this out. Your feedback is very important to our whole office. It can help us better understand your needs as well as improve your experience and satisfaction. Thank you for taking your time to complete it. We care about you.  Geoffrey Hynes, PA-C  

## 2017-04-29 ENCOUNTER — Institutional Professional Consult (permissible substitution): Payer: BLUE CROSS/BLUE SHIELD | Admitting: Pulmonary Disease

## 2017-05-13 ENCOUNTER — Ambulatory Visit (INDEPENDENT_AMBULATORY_CARE_PROVIDER_SITE_OTHER): Payer: BLUE CROSS/BLUE SHIELD | Admitting: Internal Medicine

## 2017-05-13 ENCOUNTER — Encounter: Payer: Self-pay | Admitting: Internal Medicine

## 2017-05-13 ENCOUNTER — Other Ambulatory Visit (INDEPENDENT_AMBULATORY_CARE_PROVIDER_SITE_OTHER): Payer: BLUE CROSS/BLUE SHIELD

## 2017-05-13 VITALS — BP 120/74 | HR 80 | Ht 63.0 in | Wt 166.8 lb

## 2017-05-13 DIAGNOSIS — I10 Essential (primary) hypertension: Secondary | ICD-10-CM | POA: Diagnosis not present

## 2017-05-13 DIAGNOSIS — R058 Other specified cough: Secondary | ICD-10-CM | POA: Insufficient documentation

## 2017-05-13 DIAGNOSIS — R05 Cough: Secondary | ICD-10-CM

## 2017-05-13 LAB — CBC WITH DIFFERENTIAL/PLATELET
BASOS PCT: 0.5 % (ref 0.0–3.0)
Basophils Absolute: 0 10*3/uL (ref 0.0–0.1)
EOS ABS: 0.2 10*3/uL (ref 0.0–0.7)
Eosinophils Relative: 2.6 % (ref 0.0–5.0)
HEMATOCRIT: 40.7 % (ref 36.0–46.0)
Hemoglobin: 14 g/dL (ref 12.0–15.0)
LYMPHS PCT: 18.2 % (ref 12.0–46.0)
Lymphs Abs: 1.1 10*3/uL (ref 0.7–4.0)
MCHC: 34.3 g/dL (ref 30.0–36.0)
MCV: 92.2 fl (ref 78.0–100.0)
Monocytes Absolute: 0.4 10*3/uL (ref 0.1–1.0)
Monocytes Relative: 5.9 % (ref 3.0–12.0)
NEUTROS ABS: 4.5 10*3/uL (ref 1.4–7.7)
Neutrophils Relative %: 72.8 % (ref 43.0–77.0)
PLATELETS: 250 10*3/uL (ref 150.0–400.0)
RBC: 4.42 Mil/uL (ref 3.87–5.11)
RDW: 13.1 % (ref 11.5–15.5)
WBC: 6.2 10*3/uL (ref 4.0–10.5)

## 2017-05-13 MED ORDER — LOSARTAN POTASSIUM 50 MG PO TABS: 50.0000 mg | ORAL_TABLET | Freq: Every day | ORAL | 11 refills | Status: DC

## 2017-05-13 MED ORDER — OMEPRAZOLE 20 MG PO CPDR: 20.0000 mg | DELAYED_RELEASE_CAPSULE | Freq: Two times a day (BID) | ORAL | 11 refills | Status: DC

## 2017-05-13 NOTE — Patient Instructions (Signed)
Please remember to go to the lab department downstairs in the basement  for your tests - we will call you with the results when they are available.      Stop lisinopril and start 50 mg daily of losartan    Change prilosec to 20 mg Take 30- 60 min before your first and last meals of the day until return here   For drainage / throat tickle try take CHLORPHENIRAMINE  4 mg - take one every 4 hours as needed - available over the counter- may cause drowsiness so start with just a bedtime dose or two and see how you tolerate it before trying in daytime     GERD (REFLUX)  is an extremely common cause of respiratory symptoms just like yours , many times with no obvious heartburn at all.    It can be treated with medication, but also with lifestyle changes including elevation of the head of your bed (ideally with 6 inch  bed blocks),  Smoking cessation, avoidance of late meals, excessive alcohol, and avoid fatty foods, chocolate, peppermint, colas, red wine, and acidic juices such as orange juice.  NO MINT OR MENTHOL PRODUCTS SO NO COUGH DROPS   USE SUGARLESS CANDY INSTEAD (Jolley ranchers or Stover's or Life Savers) or even ice chips will also do - the key is to swallow to prevent all throat clearing. NO OIL BASED VITAMINS - use powdered substitutes.   Please schedule a follow up office visit in 4 weeks, sooner if needed

## 2017-05-13 NOTE — Progress Notes (Signed)
Subjective:     Patient ID: Kristen Arroyo, female   DOB: 08/24/1955,    MRN: [id number]  HPI   35 yowf  Never smoker with itchy sneezy runny nose and variable  cough  X around  1980's controlled on otc's and placed on acei and around 2013(she's not specific about the dates for the cough vs the acei but positive the cough came first) with no change in pattern of nasal symptoms but chronic sensation of of pnds then  Oct 2018 severe cough the worst ever assoc with aches/sore throat/ HA > dx as bronchopna s cxr  rx zpak and then another 2 abx and a shot of steroids and no improvement in cough and also sob > referred to pulmonary clinic 05/13/2017 by Dr   Yetta Barre   05/13/2017 1st [facility] office visit/ Shaiann Mcmanamon   Chief Complaint  Patient presents with  . Pulmonary Consult    Referred by Prudy Feeler, PA.  Pt states she had PNA in Oct and Dec of 2018. She states that after being txed she still has occ SOB and cough. She occ produces some green sputum.  She states she gets SOB at work when she is walking alot or lifting something heavy.  She rarely uses her albuterol inhaler.   onset of symptoms was acute with flu like illness  but cough lingered p onset  Feels somewhat tight hs and sleeps in recliner and if lies flat feels chest / throat tight/ feels choked   Cough is worst p stirring x sev hours and mostly non productive/ not much better with saba  No obvious day to day or daytime variability or assoc excess/ purulent sputum or mucus plugs or hemoptysis or cp or chest tightness, subjective wheeze or overt sinus or hb symptoms. No unusual exposure hx or h/o childhood pna/ asthma or knowledge of premature birth.  Sleeping ok flat without nocturnal  or early am exacerbation  of respiratory  c/o's or need for noct saba. Also denies any obvious fluctuation of symptoms with weather or environmental changes or other aggravating or alleviating factors except as outlined above   Current Allergies,  Complete Past Medical History, Past Surgical History, Family History, and Social History were reviewed in [employer] record.  ROS  The following are not active complaints unless bolded Hoarseness, sore throat, dysphagia, dental problems, itching, sneezing,  nasal congestion or discharge of excess mucus or purulent secretions, ear ache,   fever, chills, sweats, unintended wt loss or wt gain, classically pleuritic or exertional cp,  orthopnea pnd or leg swelling, presyncope, palpitations, abdominal pain, anorexia, nausea, vomiting, diarrhea  or change in bowel habits or change in bladder habits, change in stools or change in urine, dysuria, hematuria,  rash, arthralgias, visual complaints, headache, numbness, weakness or ataxia or problems with walking or coordination,  change in mood/affect or memory.        Current Meds  Medication Sig  . albuterol (PROVENTIL HFA;VENTOLIN HFA) 108 (90 Base) MCG/ACT inhaler Inhale 2 puffs into the lungs every 6 (six) hours as needed for wheezing or shortness of breath.  . ALPRAZolam (XANAX) 0.5 MG tablet TAKE ONE TABLET BY MOUTH ONCE DAILY AT BEDTIME AS NEEDED FOR SLEEP.  Marland Kitchen aspirin EC 81 MG tablet Take 1 tablet (81 mg total) by mouth daily.  Marland Kitchen buPROPion (WELLBUTRIN SR) 150 MG 12 hr tablet Take 1 tablet (150 mg total) by mouth daily.  . calcium-vitamin D (OSCAL WITH D) 500-200 MG-UNIT tablet  Take 1 tablet by mouth daily.  . diclofenac (VOLTAREN) 75 MG EC tablet Take 1 tablet (75 mg total) by mouth 2 (two) times daily.  . DiphenhydrAMINE HCl (BENADRYL ALLERGY PO) Take 1 tablet by mouth as needed (ALLERGIES).   . hydrochlorothiazide (HYDRODIURIL) 25 MG tablet Take 1 tablet (25 mg total) by mouth daily.  Marland Kitchen. lactase (LACTAID) 3000 units tablet Take 3,000 Units by mouth as needed (STOMACH UPSET).  Marland Kitchen. loratadine (CLARITIN) 10 MG tablet Take 1 tablet (10 mg total) by mouth daily.  . Multiple Vitamin (MULTIVITAMIN) tablet Take 1 tablet by mouth daily.   Marland Kitchen. terbinafine (LAMISIL) 250 MG tablet Take 1 tablet (250 mg total) by mouth daily.  . [DISCONTINUED] chlorpheniramine-HYDROcodone (TUSSIONEX PENNKINETIC ER) 10-8 MG/5ML SUER Take 5 mLs by mouth every 12 (twelve) hours as needed for cough.  . [  lisinopril (PRINIVIL,ZESTRIL) 5 MG tablet TAKE 1 TABLET BY MOUTH ONCE DAILY  . [ omeprazole (PRILOSEC) 20 MG capsule Take 1 capsule (20 mg total) by mouth daily.         Review of Systems     Objective:   Physical Exam    amb wf  With freq throat  Wt Readings from Last 3 Encounters:  05/13/17 166 lb 12.8 oz (75.7 kg)  04/03/17 164 lb 9.6 oz (74.7 kg)  03/16/17 165 lb 6.4 oz (75 kg)     Vital signs reviewed - Note on arrival 02 sats  100% on RA       HEENT: nl dentition, turbinates bilaterally, and oropharynx which is pristine. Nl external ear canals without cough reflex   NECK :  without JVD/Nodes/TM/ nl carotid upstrokes bilaterally   LUNGS: no acc muscle use,  Nl contour chest which is clear to A and P bilaterally without cough on insp or exp maneuvers   CV:  RRR  no s3 or murmur or increase in P2, and no edema   ABD:  soft and nontender with nl inspiratory excursion in the supine position. No bruits or organomegaly appreciated, bowel sounds nl  MS:  Nl gait/ ext warm without deformities, calf tenderness, cyanosis or clubbing No obvious joint restrictions   SKIN: warm and dry without lesions    NEURO:  alert, approp, nl sensorium with  no motor or cerebellar deficits apparent.     I personally reviewed images and agree with radiology impression as follows:  CXR:   03/16/17   Labs ordered 05/13/2017  Allergy profile     Assessment:

## 2017-05-14 ENCOUNTER — Encounter: Payer: Self-pay | Admitting: Internal Medicine

## 2017-05-14 LAB — RESPIRATORY ALLERGY PANEL REGION II W/ RFLX: ~~LOC~~
Allergen, A. alternata, m6: <0.1 kU/L
Allergen, Cedar tree, t12: <0.1 kU/L
Allergen, Comm Silver Birch, t9: <0.1 kU/L
Allergen, Cottonwood, t14: <0.1 kU/L
Allergen, D pternoyssinus,d7: <0.1 kU/L
Allergen, Mouse Urine Protein, e78: <0.1 kU/L
Allergen, Mulberry, t76: <0.1 kU/L
Allergen, Oak,t7: <0.1 kU/L
Allergen, P. notatum, m1: <0.1 kU/L
Aspergillus fumigatus, m3: <0.1 kU/L
Bermuda Grass: <0.1 kU/L
Box Elder IgE: <0.1 kU/L
CLADOSPORIUM HERBARUM (M2) IGE: <0.1 kU/L
COMMON RAGWEED (SHORT) (W1) IGE: <0.1 kU/L
Cat Dander: <0.1 kU/L
Class: 0
Class: 0
Class: 0
Class: 0
Class: 0
Class: 0
Class: 0
Class: 0
Class: 0
Class: 0
Class: 0
Class: 0
Class: 0
Class: 0
Class: 0
Class: 0
Class: 0
Class: 0
Class: 0
Class: 0
Class: 0
Class: 0
Class: 0
Class: 0
Cockroach: <0.1 kU/L
D. farinae: <0.1 kU/L
Dog Dander: <0.1 kU/L
Elm IgE: <0.1 kU/L
IgE (Immunoglobulin E), Serum: 61 kU/L
Johnson Grass: <0.1 kU/L
Pecan/Hickory Tree IgE: <0.1 kU/L
Rough Pigweed  IgE: <0.1 kU/L
Sheep Sorrel IgE: <0.1 kU/L
Timothy Grass: <0.1 kU/L

## 2017-05-14 LAB — INTERPRETATION:

## 2017-05-14 NOTE — Assessment & Plan Note (Signed)
In the best review of chronic cough to date ( NEJM 2016 375 [id number]) ,  ACEi are now felt to cause cough in up to  20% of pts which is a 4 fold increase from previous reports and does not include the variety of non-specific complaints we see in pulmonary clinic in pts on ACEi but previously attributed to another dx like  Copd/asthma and  include PNDS, throat and chest congestion, "bronchitis", unexplained dyspnea and noct "strangling" sensations, and hoarseness, but also  atypical /refractory GERD symptoms like dysphagia and "bad heartburn"   The only way I know  to prove this is not an "ACEi Case" is a trial off ACEi x a minimum of 6 weeks then regroup.   Try losartan 50 mg daily and return in 4 weeks

## 2017-05-14 NOTE — Assessment & Plan Note (Signed)
The most common causes of chronic cough in immunocompetent adults include the following: upper airway cough syndrome (UACS), previously referred to as postnasal drip syndrome (PNDS), which is caused by variety of rhinosinus conditions; (2) asthma; (3) GERD; (4) chronic bronchitis from cigarette smoking or other inhaled environmental irritants; (5) nonasthmatic eosinophilic bronchitis; and (6) bronchiectasis.   These conditions, singly or in combination, have accounted for up to 94% of the causes of chronic cough in prospective studies.   Other conditions have constituted no >6% of the causes in prospective studies These have included bronchogenic carcinoma, chronic interstitial pneumonia, sarcoidosis, left ventricular failure, ACEI-induced cough, and aspiration from a condition associated with pharyngeal dysfunction.    Chronic cough is often simultaneously caused by more than one condition. A single cause has been found from 38 to 82% of the time, multiple causes from 18 to 62%. Multiply caused cough has been the result of three diseases up to 42% of the time.        Upper airway cough syndrome (previously labeled PNDS),  is so named because it's frequently impossible to sort out how much is  CR/sinusitis with freq throat clearing (which can be related to primary GERD)   vs  causing  secondary (" extra esophageal")  GERD from wide swings in gastric pressure that occur with throat clearing, often  promoting self use of mint and menthol lozenges that reduce the lower esophageal sphincter tone and exacerbate the problem further in a cyclical fashion.   These are the same pts (now being labeled as having "irritable larynx syndrome" by some cough centers) who not infrequently have a history of having failed to tolerate ace inhibitors,  dry powder inhalers or biphosphonates or report having atypical/extraesophageal reflux symptoms that don't respond to standard doses of PPI  and are easily confused as having  aecopd or asthma flares by even experienced allergists/ pulmonologists (myself included).    rec short term max rx for gerd and off acei and send allergy profile then regroup in 4 weeks   Total time devoted to counseling  > 50 % of initial 60 min office visit:  review case with pt/ discussion of options/alternatives/ personally creating written customized instructions  in presence of pt  then going over those specific  Instructions directly with the pt including how to use all of the meds but in particular covering each new medication in detail and the difference between the maintenance= "automatic" meds and the prns using an action plan format for the latter (If this problem/symptom => do that organization reading Left to right).  Please see AVS from this visit for a full list of these instructions which I personally wrote for this pt and  are unique to this visit.

## 2017-06-10 ENCOUNTER — Ambulatory Visit: Payer: BLUE CROSS/BLUE SHIELD | Admitting: Internal Medicine

## 2017-06-23 ENCOUNTER — Other Ambulatory Visit: Payer: Self-pay | Admitting: Physician Assistant

## 2017-06-23 DIAGNOSIS — F3342 Major depressive disorder, recurrent, in full remission: Secondary | ICD-10-CM

## 2017-06-23 NOTE — Telephone Encounter (Signed)
Last seen 04/03/17  Kristen Arroyo

## 2017-07-21 ENCOUNTER — Ambulatory Visit (INDEPENDENT_AMBULATORY_CARE_PROVIDER_SITE_OTHER): Payer: BLUE CROSS/BLUE SHIELD | Admitting: Family

## 2017-07-21 ENCOUNTER — Encounter: Payer: Self-pay | Admitting: Family

## 2017-07-21 VITALS — BP 123/65 | HR 88 | Temp 97.1°F | Ht 63.0 in | Wt 166.4 lb

## 2017-07-21 DIAGNOSIS — J209 Acute bronchitis, unspecified: Secondary | ICD-10-CM

## 2017-07-21 MED ORDER — PREDNISONE 10 MG (21) PO TBPK: ORAL_TABLET | ORAL | 0 refills | Status: DC

## 2017-07-21 NOTE — Patient Instructions (Signed)

## 2017-07-21 NOTE — Progress Notes (Signed)
   Subjective:    Patient ID: Kristen Arroyo, female    DOB: 1955-07-02, 62 y.o.   MRN: [id number]  Cough  This is a new problem. The current episode started 1 to 4 weeks ago. The problem has been unchanged. The problem occurs every few minutes. The cough is productive of purulent sputum. Associated symptoms include ear congestion, ear pain, headaches, nasal congestion, postnasal drip, rhinorrhea, shortness of breath and wheezing. Pertinent negatives include no chills, fever or myalgias. The symptoms are aggravated by lying down. She has tried rest and OTC cough suppressant for the symptoms. The treatment provided mild relief. There is no history of asthma or COPD.      Review of Systems  Constitutional: Negative for chills and fever.  HENT: Positive for ear pain, postnasal drip and rhinorrhea.   Respiratory: Positive for cough, shortness of breath and wheezing.   Musculoskeletal: Negative for myalgias.  Neurological: Positive for headaches.  All other systems reviewed and are negative.      Objective:   Physical Exam  Constitutional: She is oriented to person, place, and time. She appears well-developed and well-nourished. No distress.  HENT:  Head: Normocephalic and atraumatic.  Right Ear: External ear normal.  Left Ear: External ear normal.  Nose: Mucosal edema and rhinorrhea present.  Mouth/Throat: Posterior oropharyngeal erythema present.  Eyes: Pupils are equal, round, and reactive to light.  Neck: Normal range of motion. Neck supple. No thyromegaly present.  Cardiovascular: Normal rate, regular rhythm, normal heart sounds and intact distal pulses.  No murmur heard. Pulmonary/Chest: Effort normal and breath sounds normal. No respiratory distress. She has no wheezes.  Abdominal: Soft. Bowel sounds are normal. She exhibits no distension. There is no tenderness.  Musculoskeletal: Normal range of motion. She exhibits no edema or tenderness.  Neurological: She is alert and  oriented to person, place, and time. She has normal reflexes. No cranial nerve deficit.  Skin: Skin is warm and dry.  Psychiatric: She has a normal mood and affect. Her behavior is normal. Judgment and thought content normal.  Vitals reviewed.     BP 123/65   Pulse 88   Temp (!) 97.1 F (36.2 C) (Oral)   Ht 5\' 3"  (1.6 m)   Wt 166 lb 6.4 oz (75.5 kg)   BMI 29.48 kg/m      Assessment & Plan:  1. Acute bronchitis, unspecified organism - Take meds as prescribed - Use a cool mist humidifier  -Use saline nose sprays frequently -Force fluids -For any cough or congestion  Use plain Mucinex- regular strength or max strength is fine -For fever or aces or pains- take tylenol or ibuprofen. -Throat lozenges if help -RTO if symptoms worsen or do not improve - predniSONE (STERAPRED UNI-PAK 21 TAB) 10 MG (21) TBPK tablet; Use as directed  Dispense: 21 tablet; Refill: 0     Jannifer Rodneyhristy Jeremy Ditullio, FNP

## 2017-07-24 ENCOUNTER — Telehealth: Payer: Self-pay | Admitting: Physician Assistant

## 2017-07-24 ENCOUNTER — Other Ambulatory Visit: Payer: Self-pay | Admitting: Physician Assistant

## 2017-07-24 MED ORDER — DOXYCYCLINE HYCLATE 100 MG PO TABS: 100.0000 mg | ORAL_TABLET | Freq: Two times a day (BID) | ORAL | 0 refills | Status: DC

## 2017-07-24 MED ORDER — HYDROCODONE-HOMATROPINE 5-1.5 MG/5ML PO SYRP: 5.0000 mL | ORAL_SOLUTION | Freq: Four times a day (QID) | ORAL | 0 refills | Status: DC | PRN

## 2017-07-24 NOTE — Telephone Encounter (Signed)
Spoke to angel directly.

## 2017-07-24 NOTE — Telephone Encounter (Signed)
Sent doxycycline and hycodan to the pharmacy

## 2017-07-24 NOTE — Telephone Encounter (Signed)
Patient aware rx sent to pharmacy.  

## 2017-07-24 NOTE — Telephone Encounter (Signed)
Pt saw christy recently and was given prednisone and was told to call back if she wasn't getting better and we would call something else in. Pt states that she is not getting better and she also thought she had enough cough syrup at home but doesn't and wants to know if we could send some of that in as well. [facility] [place]

## 2017-10-13 ENCOUNTER — Other Ambulatory Visit: Payer: Self-pay | Admitting: Physician Assistant

## 2017-10-13 DIAGNOSIS — I10 Essential (primary) hypertension: Secondary | ICD-10-CM

## 2017-11-25 ENCOUNTER — Other Ambulatory Visit: Payer: Self-pay | Admitting: Physician Assistant

## 2017-11-25 DIAGNOSIS — F3342 Major depressive disorder, recurrent, in full remission: Secondary | ICD-10-CM

## 2017-11-28 ENCOUNTER — Other Ambulatory Visit: Payer: Self-pay | Admitting: Physician Assistant

## 2017-11-28 DIAGNOSIS — I10 Essential (primary) hypertension: Secondary | ICD-10-CM

## 2017-12-03 ENCOUNTER — Other Ambulatory Visit: Payer: Self-pay | Admitting: Physician Assistant

## 2017-12-03 DIAGNOSIS — F3342 Major depressive disorder, recurrent, in full remission: Secondary | ICD-10-CM

## 2017-12-03 DIAGNOSIS — I10 Essential (primary) hypertension: Secondary | ICD-10-CM

## 2018-02-16 ENCOUNTER — Other Ambulatory Visit: Payer: Self-pay | Admitting: Physician Assistant

## 2018-02-24 ENCOUNTER — Other Ambulatory Visit: Payer: Self-pay | Admitting: Physician Assistant

## 2018-02-24 DIAGNOSIS — R5381 Other malaise: Secondary | ICD-10-CM

## 2018-02-26 ENCOUNTER — Other Ambulatory Visit: Payer: Self-pay | Admitting: Physician Assistant

## 2018-03-01 ENCOUNTER — Telehealth: Payer: Self-pay

## 2018-03-01 ENCOUNTER — Other Ambulatory Visit: Payer: Self-pay | Admitting: Physician Assistant

## 2018-03-01 NOTE — Telephone Encounter (Signed)
Last seen 07/21/17  Kristen Arroyo

## 2018-03-01 NOTE — Telephone Encounter (Signed)
Needs an order for DX [place]

## 2018-03-02 ENCOUNTER — Other Ambulatory Visit: Payer: Self-pay | Admitting: Physician Assistant

## 2018-03-02 DIAGNOSIS — N644 Mastodynia: Secondary | ICD-10-CM

## 2018-03-02 NOTE — Telephone Encounter (Signed)
Pt aware of appointment date/time 

## 2018-03-05 ENCOUNTER — Ambulatory Visit: Payer: BLUE CROSS/BLUE SHIELD

## 2018-03-05 ENCOUNTER — Ambulatory Visit
Admission: RE | Admit: 2018-03-05 | Discharge: 2018-03-05 | Disposition: A | Discharge status: Home / Self Care | Payer: BLUE CROSS/BLUE SHIELD | Source: Ambulatory Visit | Attending: Physician Assistant | Admitting: Physician Assistant

## 2018-03-05 DIAGNOSIS — N644 Mastodynia: Secondary | ICD-10-CM

## 2018-04-21 ENCOUNTER — Ambulatory Visit (INDEPENDENT_AMBULATORY_CARE_PROVIDER_SITE_OTHER): Payer: BLUE CROSS/BLUE SHIELD | Admitting: Physician Assistant

## 2018-04-21 ENCOUNTER — Encounter: Payer: Self-pay | Admitting: Physician Assistant

## 2018-04-21 VITALS — BP 124/73 | HR 81 | Temp 97.9°F | Ht 63.0 in | Wt 166.4 lb

## 2018-04-21 DIAGNOSIS — M25549 Pain in joints of unspecified hand: Secondary | ICD-10-CM

## 2018-04-21 DIAGNOSIS — Z01411 Encounter for gynecological examination (general) (routine) with abnormal findings: Secondary | ICD-10-CM | POA: Diagnosis not present

## 2018-04-21 DIAGNOSIS — I10 Essential (primary) hypertension: Secondary | ICD-10-CM | POA: Diagnosis not present

## 2018-04-21 DIAGNOSIS — F3342 Major depressive disorder, recurrent, in full remission: Secondary | ICD-10-CM

## 2018-04-21 DIAGNOSIS — Z01419 Encounter for gynecological examination (general) (routine) without abnormal findings: Secondary | ICD-10-CM

## 2018-04-21 MED ORDER — HYDROCHLOROTHIAZIDE 25 MG PO TABS: 25.0000 mg | ORAL_TABLET | Freq: Every day | ORAL | 3 refills | Status: DC

## 2018-04-21 MED ORDER — LISINOPRIL 5 MG PO TABS: ORAL_TABLET | ORAL | 3 refills | Status: DC

## 2018-04-21 MED ORDER — OMEPRAZOLE 20 MG PO CPDR: 20.0000 mg | DELAYED_RELEASE_CAPSULE | Freq: Two times a day (BID) | ORAL | 11 refills | Status: DC

## 2018-04-21 MED ORDER — ROSUVASTATIN CALCIUM 20 MG PO TABS: 20.0000 mg | ORAL_TABLET | Freq: Every day | ORAL | 0 refills | Status: DC

## 2018-04-21 MED ORDER — ROSUVASTATIN CALCIUM 20 MG PO TABS: 20.0000 mg | ORAL_TABLET | Freq: Every day | ORAL | 3 refills | Status: DC

## 2018-04-21 MED ORDER — ALPRAZOLAM 0.5 MG PO TABS: 0.5000 mg | ORAL_TABLET | Freq: Every evening | ORAL | 1 refills | Status: DC | PRN

## 2018-04-21 MED ORDER — DICLOFENAC SODIUM 75 MG PO TBEC: 75.0000 mg | DELAYED_RELEASE_TABLET | Freq: Two times a day (BID) | ORAL | 3 refills | Status: DC

## 2018-04-21 MED ORDER — BUPROPION HCL ER (SR) 150 MG PO TB12: 150.0000 mg | ORAL_TABLET | Freq: Every day | ORAL | 3 refills | Status: DC

## 2018-04-21 NOTE — Patient Instructions (Addendum)
[facility]  [address] [place], [place] [address] Phone: [phone] Fax: [phone]  Harlon Ditty [phone]

## 2018-04-22 LAB — CBC WITH DIFFERENTIAL/PLATELET
BASOS: 0 %
Basophils Absolute: 0 10*3/uL (ref 0.0–0.2)
EOS (ABSOLUTE): 0.1 10*3/uL (ref 0.0–0.4)
Eos: 2 %
Hematocrit: 38.2 % (ref 34.0–46.6)
Hemoglobin: 13.8 g/dL (ref 11.1–15.9)
IMMATURE GRANS (ABS): 0 10*3/uL (ref 0.0–0.1)
Immature Granulocytes: 0 %
Lymphocytes Absolute: 1 10*3/uL (ref 0.7–3.1)
Lymphs: 19 %
MCH: 32.5 pg (ref 26.6–33.0)
MCHC: 36.1 g/dL — ABNORMAL HIGH (ref 31.5–35.7)
MCV: 90 fL (ref 79–97)
MONOCYTES: 7 %
Monocytes Absolute: 0.4 10*3/uL (ref 0.1–0.9)
NEUTROS ABS: 4 10*3/uL (ref 1.4–7.0)
NEUTROS PCT: 72 %
PLATELETS: 215 10*3/uL (ref 150–450)
RBC: 4.25 x10E6/uL (ref 3.77–5.28)
RDW: 11.9 % (ref 11.7–15.4)
WBC: 5.5 10*3/uL (ref 3.4–10.8)

## 2018-04-22 LAB — LIPID PANEL
CHOLESTEROL TOTAL: 175 mg/dL (ref 100–199)
Chol/HDL Ratio: 3.8 ratio (ref 0.0–4.4)
HDL: 46 mg/dL (ref >39)
LDL Calculated: 106 mg/dL — ABNORMAL HIGH (ref 0–99)
TRIGLYCERIDES: 114 mg/dL (ref 0–149)
VLDL CHOLESTEROL CAL: 23 mg/dL (ref 5–40)

## 2018-04-22 LAB — CMP14+EGFR
A/G RATIO: 1.7 (ref 1.2–2.2)
ALK PHOS: 87 IU/L (ref 39–117)
ALT: 23 IU/L (ref 0–32)
AST: 19 IU/L (ref 0–40)
Albumin: 4.5 g/dL (ref 3.8–4.8)
BILIRUBIN TOTAL: 0.5 mg/dL (ref 0.0–1.2)
BUN/Creatinine Ratio: 25 (ref 12–28)
BUN: 17 mg/dL (ref 8–27)
CHLORIDE: 100 mmol/L (ref 96–106)
CO2: 24 mmol/L (ref 20–29)
Calcium: 9.7 mg/dL (ref 8.7–10.3)
Creatinine, Ser: 0.68 mg/dL (ref 0.57–1.00)
GFR calc non Af Amer: 95 mL/min/{1.73_m2} (ref >59)
GFR, EST AFRICAN AMERICAN: 109 mL/min/{1.73_m2} (ref >59)
GLUCOSE: 90 mg/dL (ref 65–99)
Globulin, Total: 2.6 g/dL (ref 1.5–4.5)
POTASSIUM: 3.8 mmol/L (ref 3.5–5.2)
Sodium: 142 mmol/L (ref 134–144)
Total Protein: 7.1 g/dL (ref 6.0–8.5)

## 2018-04-22 LAB — TSH: TSH: 0.672 u[IU]/mL (ref 0.450–4.500)

## 2018-04-22 NOTE — Progress Notes (Signed)
BP 124/73   Pulse 81   Temp 97.9 F (36.6 C) (Oral)   Ht '5\' 3"'  (1.6 m)   Wt 166 lb 6.4 oz (75.5 kg)   BMI 29.48 kg/m    Subjective:    Patient ID: Kristen Arroyo, female    DOB: 11-18-1955, 63 y.o.   MRN: [id number]  HPI: Kristen Arroyo is a 63 y.o. female presenting on 04/21/2018 for Annual Exam This patient comes in for her well annual exam and female exam.  She has no gynecological complaints.  We are past the age where she has to have a Pap smear collected anymore.  We did do a bimanual exam because she still does have an intact uterus and ovaries.  She also still complaining of significant arthralgias in multiple joints and particularly in her feet.  She would like to go to podiatry.  It is been many years that she went before.  But at this time the joints are getting very bad.  Even with the medication she is taking in the good shoes she is wearing she still has significant problems with her feet daily.  She also is here for refills on her regular medications that include treatment for depression and hypertension.  Her labs will also be drawn today.  There are no other complaints at this time.   Past Medical History:  Diagnosis Date  . Anxiety   . Arthritis    knees  . GERD (gastroesophageal reflux disease)   . History of seasonal allergies   . Hyperlipidemia   . Hypertension    Relevant past medical, surgical, family and social history reviewed and updated as indicated. Interim medical history since our last visit reviewed. Allergies and medications reviewed and updated. DATA REVIEWED: CHART IN EPIC  Family History reviewed for pertinent findings.  Review of Systems  Constitutional: Negative.  Negative for activity change, fatigue and fever.  HENT: Negative.   Eyes: Negative.   Respiratory: Negative.  Negative for cough.   Cardiovascular: Negative.  Negative for chest pain.  Gastrointestinal: Negative.  Negative for abdominal pain.  Endocrine: Negative.     Genitourinary: Negative.  Negative for dysuria.  Musculoskeletal: Positive for arthralgias, gait problem, joint swelling and myalgias.  Skin: Negative.     Allergies as of 04/21/2018   No Known Allergies     Medication List       Accurate as of April 21, 2018 11:59 PM. Always use your most recent med list.        albuterol 108 (90 Base) MCG/ACT inhaler Commonly known as:  PROVENTIL HFA;VENTOLIN HFA Inhale 2 puffs into the lungs every 6 (six) hours as needed for wheezing or shortness of breath.   ALPRAZolam 0.5 MG tablet Commonly known as:  XANAX Take 1 tablet (0.5 mg total) by mouth at bedtime as needed. for sleep   aspirin EC 81 MG tablet Take 1 tablet (81 mg total) by mouth daily.   BENADRYL ALLERGY PO Take 1 tablet by mouth as needed (ALLERGIES).   buPROPion 150 MG 12 hr tablet Commonly known as:  WELLBUTRIN SR Take 1 tablet (150 mg total) by mouth daily.   calcium-vitamin D 500-200 MG-UNIT tablet Commonly known as:  OSCAL WITH D Take 1 tablet by mouth daily.   diclofenac 75 MG EC tablet Commonly known as:  VOLTAREN Take 1 tablet (75 mg total) by mouth 2 (two) times daily.   hydrochlorothiazide 25 MG tablet Commonly known as:  HYDRODIURIL Take 1 tablet (  25 mg total) by mouth daily.   lactase 3000 units tablet Commonly known as:  LACTAID Take 3,000 Units by mouth as needed (STOMACH UPSET).   lisinopril 5 MG tablet Commonly known as:  PRINIVIL,ZESTRIL TAKE 1 TABLET BY MOUTH ONCE DAILY MUST  BE  SEEN  FOR  ADDITIONAL  REFILL   loratadine 10 MG tablet Commonly known as:  CLARITIN Take 1 tablet (10 mg total) by mouth daily.   multivitamin tablet Take 1 tablet by mouth daily.   omeprazole 20 MG capsule Commonly known as:  PRILOSEC Take 1 capsule (20 mg total) by mouth 2 (two) times daily before a meal.   rosuvastatin 20 MG tablet Commonly known as:  CRESTOR Take 1 tablet (20 mg total) by mouth daily. Please call to schedule yearly f/u with Dr Burt Knack  for future refills (1st attempt)   rosuvastatin 20 MG tablet Commonly known as:  CRESTOR Take 1 tablet (20 mg total) by mouth daily.   terbinafine 250 MG tablet Commonly known as:  LAMISIL Take 1 tablet (250 mg total) by mouth daily.          Objective:    BP 124/73   Pulse 81   Temp 97.9 F (36.6 C) (Oral)   Ht '5\' 3"'  (1.6 m)   Wt 166 lb 6.4 oz (75.5 kg)   BMI 29.48 kg/m   No Known Allergies  Wt Readings from Last 3 Encounters:  04/21/18 166 lb 6.4 oz (75.5 kg)  07/21/17 166 lb 6.4 oz (75.5 kg)  05/13/17 166 lb 12.8 oz (75.7 kg)    Physical Exam Constitutional:      Appearance: She is well-developed.  HENT:     Head: Normocephalic and atraumatic.     Right Ear: Tympanic membrane, ear canal and external ear normal.     Left Ear: Tympanic membrane, ear canal and external ear normal.     Nose: Nose normal. No rhinorrhea.     Mouth/Throat:     Pharynx: No oropharyngeal exudate or posterior oropharyngeal erythema.  Eyes:     Conjunctiva/sclera: Conjunctivae normal.     Pupils: Pupils are equal, round, and reactive to light.  Neck:     Musculoskeletal: Normal range of motion and neck supple.  Cardiovascular:     Rate and Rhythm: Normal rate and regular rhythm.     Heart sounds: Normal heart sounds.  Pulmonary:     Effort: Pulmonary effort is normal.     Breath sounds: Normal breath sounds.  Abdominal:     General: Bowel sounds are normal.     Palpations: Abdomen is soft.  Musculoskeletal:        General: Swelling, tenderness and deformity present.  Skin:    General: Skin is warm and dry.     Findings: No rash.  Neurological:     Mental Status: She is alert and oriented to person, place, and time.     Deep Tendon Reflexes: Reflexes are normal and symmetric.  Psychiatric:        Behavior: Behavior normal.        Thought Content: Thought content normal.        Judgment: Judgment normal.     Results for orders placed or performed in visit on 04/21/18  CBC  with Differential/Platelet  Result Value Ref Range   WBC 5.5 3.4 - 10.8 x10E3/uL   RBC 4.25 3.77 - 5.28 x10E6/uL   Hemoglobin 13.8 11.1 - 15.9 g/dL   Hematocrit 38.2 34.0 - 46.6 %  MCV 90 79 - 97 fL   MCH 32.5 26.6 - 33.0 pg   MCHC 36.1 (H) 31.5 - 35.7 g/dL   RDW 11.9 11.7 - 15.4 %   Platelets 215 150 - 450 x10E3/uL   Neutrophils 72 Not Estab. %   Lymphs 19 Not Estab. %   Monocytes 7 Not Estab. %   Eos 2 Not Estab. %   Basos 0 Not Estab. %   Neutrophils Absolute 4.0 1.4 - 7.0 x10E3/uL   Lymphocytes Absolute 1.0 0.7 - 3.1 x10E3/uL   Monocytes Absolute 0.4 0.1 - 0.9 x10E3/uL   EOS (ABSOLUTE) 0.1 0.0 - 0.4 x10E3/uL   Basophils Absolute 0.0 0.0 - 0.2 x10E3/uL   Immature Granulocytes 0 Not Estab. %   Immature Grans (Abs) 0.0 0.0 - 0.1 x10E3/uL  CMP14+EGFR  Result Value Ref Range   Glucose 90 65 - 99 mg/dL   BUN 17 8 - 27 mg/dL   Creatinine, Ser 0.68 0.57 - 1.00 mg/dL   GFR calc non Af Amer 95 >59 mL/min/1.73   GFR calc Af Amer 109 >59 mL/min/1.73   BUN/Creatinine Ratio 25 12 - 28   Sodium 142 134 - 144 mmol/L   Potassium 3.8 3.5 - 5.2 mmol/L   Chloride 100 96 - 106 mmol/L   CO2 24 20 - 29 mmol/L   Calcium 9.7 8.7 - 10.3 mg/dL   Total Protein 7.1 6.0 - 8.5 g/dL   Albumin 4.5 3.8 - 4.8 g/dL   Globulin, Total 2.6 1.5 - 4.5 g/dL   Albumin/Globulin Ratio 1.7 1.2 - 2.2   Bilirubin Total 0.5 0.0 - 1.2 mg/dL   Alkaline Phosphatase 87 39 - 117 IU/L   AST 19 0 - 40 IU/L   ALT 23 0 - 32 IU/L  Lipid panel  Result Value Ref Range   Cholesterol, Total 175 100 - 199 mg/dL   Triglycerides 114 0 - 149 mg/dL   HDL 46 >39 mg/dL   VLDL Cholesterol Cal 23 5 - 40 mg/dL   LDL Calculated 106 (H) 0 - 99 mg/dL   Chol/HDL Ratio 3.8 0.0 - 4.4 ratio  TSH  Result Value Ref Range   TSH 0.672 0.450 - 4.500 uIU/mL      Assessment & Plan:   1. Arthralgia of hand, unspecified laterality - diclofenac (VOLTAREN) 75 MG EC tablet; Take 1 tablet (75 mg total) by mouth 2 (two) times daily.  Dispense:  180 tablet; Refill: 3  2. Recurrent major depressive disorder, in full remission ([place]) - buPROPion (WELLBUTRIN SR) 150 MG 12 hr tablet; Take 1 tablet (150 mg total) by mouth daily.  Dispense: 90 tablet; Refill: 3 - ALPRAZolam (XANAX) 0.5 MG tablet; Take 1 tablet (0.5 mg total) by mouth at bedtime as needed. for sleep  Dispense: 30 tablet; Refill: 1  3. Essential hypertension - lisinopril (PRINIVIL,ZESTRIL) 5 MG tablet; TAKE 1 TABLET BY MOUTH ONCE DAILY MUST  BE  SEEN  FOR  ADDITIONAL  REFILL  Dispense: 90 tablet; Refill: 3 - hydrochlorothiazide (HYDRODIURIL) 25 MG tablet; Take 1 tablet (25 mg total) by mouth daily.  Dispense: 90 tablet; Refill: 3  4. Well woman exam - CBC with Differential/Platelet - CMP14+EGFR - Lipid panel - TSH - Ambulatory referral to Podiatry   Continue all other maintenance medications as listed above.  Follow up plan: Return in about 1 year (around 04/22/2019).  Educational handout given for  [place] PA-C [place],  [place] [address] [phone]   04/22/2018, 2:32 PM

## 2018-05-18 ENCOUNTER — Encounter: Payer: Self-pay | Admitting: Cardiovascular Disease

## 2018-06-04 ENCOUNTER — Encounter: Payer: Self-pay | Admitting: Cardiovascular Disease

## 2018-06-04 ENCOUNTER — Ambulatory Visit: Payer: BLUE CROSS/BLUE SHIELD | Admitting: Cardiovascular Disease

## 2018-06-04 VITALS — BP 118/82 | HR 67 | Ht 63.0 in | Wt 168.0 lb

## 2018-06-04 DIAGNOSIS — E782 Mixed hyperlipidemia: Secondary | ICD-10-CM

## 2018-06-04 DIAGNOSIS — R0789 Other chest pain: Secondary | ICD-10-CM | POA: Diagnosis not present

## 2018-06-04 DIAGNOSIS — I10 Essential (primary) hypertension: Secondary | ICD-10-CM

## 2018-06-04 LAB — BASIC METABOLIC PANEL
BUN/Creatinine Ratio: 29 — ABNORMAL HIGH (ref 12–28)
BUN: 21 mg/dL (ref 8–27)
CO2: 24 mmol/L (ref 20–29)
Calcium: 9.9 mg/dL (ref 8.7–10.3)
Chloride: 100 mmol/L (ref 96–106)
Creatinine, Ser: 0.73 mg/dL (ref 0.57–1.00)
GFR calc Af Amer: 103 mL/min/{1.73_m2} (ref >59)
GFR calc non Af Amer: 89 mL/min/{1.73_m2} (ref >59)
Glucose: 93 mg/dL (ref 65–99)
Potassium: 4.2 mmol/L (ref 3.5–5.2)
Sodium: 139 mmol/L (ref 134–144)

## 2018-06-04 MED ORDER — ROSUVASTATIN CALCIUM 20 MG PO TABS: 20.0000 mg | ORAL_TABLET | Freq: Every day | ORAL | 3 refills | Status: DC

## 2018-06-04 NOTE — Progress Notes (Signed)
Cardiology Office Note:    Date:  06/04/2018   ID:  Kristen Arroyo, DOB 06/27/1955, MRN [id number]  PCP:  Remus Loffler, PA-C  Cardiologist:  Tonny Bollman, MD  Electrophysiologist:  None   Referring MD: Remus Loffler, PA-C   Chief Complaint  Patient presents with  . Chest Pain    History of Present Illness:    Kristen Arroyo is a 63 y.o. female with a hx of hypertension and hyperlipidemia, presenting for follow-up evaluation.  The patient has a strong family history of coronary artery disease in many of the women on her side of the family.  Her mother, grandmother, and siblings have all had cardiac problems and specifically have had CAD and bypass surgery.  The patient has never been a smoker.  She has been managed with antihypertensive therapy, low-dose aspirin for antiplatelet therapy, and a statin drug.  The patient is here with her husband today.  She works as a Production designer, theatre/television/film at [employer] and she is physically active with her job.  She has no exertional symptoms.  She does admit to intermittent jaw pain that occurs primarily at rest.  Symptoms are self-limited.  She also has episodic chest discomfort that she describes as a pressure-like sensation around the lateral side of the right chest sometimes into the center of her chest.  This is not brought on by any specific movement or activity.  She generally takes aspirin and symptoms ultimately abate.  There is no progressive nature but it is something she notices on an intermittent basis.  Past Medical History:  Diagnosis Date  . Anxiety   . Arthritis    knees  . GERD (gastroesophageal reflux disease)   . History of seasonal allergies   . Hyperlipidemia   . Hypertension     Past Surgical History:  Procedure Laterality Date  . CARPAL TUNNEL RELEASE  2006   right  . KNEE ARTHROSCOPY     left: torn meniscus    Current Medications: Current Meds  Medication Sig  . albuterol (PROVENTIL HFA;VENTOLIN HFA) 108 (90 Base) MCG/ACT  inhaler Inhale 2 puffs into the lungs every 6 (six) hours as needed for wheezing or shortness of breath.  . ALPRAZolam (XANAX) 0.5 MG tablet Take 1 tablet (0.5 mg total) by mouth at bedtime as needed. for sleep  . aspirin EC 81 MG tablet Take 1 tablet (81 mg total) by mouth daily.  Marland Kitchen buPROPion (WELLBUTRIN SR) 150 MG 12 hr tablet Take 1 tablet (150 mg total) by mouth daily.  . calcium-vitamin D (OSCAL WITH D) 500-200 MG-UNIT tablet Take 1 tablet by mouth daily.  . diclofenac (VOLTAREN) 75 MG EC tablet Take 1 tablet (75 mg total) by mouth 2 (two) times daily.  . DiphenhydrAMINE HCl (BENADRYL ALLERGY PO) Take 1 tablet by mouth as needed (ALLERGIES).   . hydrochlorothiazide (HYDRODIURIL) 25 MG tablet Take 1 tablet (25 mg total) by mouth daily.  Marland Kitchen lactase (LACTAID) 3000 units tablet Take 3,000 Units by mouth as needed (STOMACH UPSET).  Marland Kitchen lisinopril (PRINIVIL,ZESTRIL) 5 MG tablet TAKE 1 TABLET BY MOUTH ONCE DAILY MUST  BE  SEEN  FOR  ADDITIONAL  REFILL  . loratadine (CLARITIN) 10 MG tablet Take 1 tablet (10 mg total) by mouth daily.  . Multiple Vitamin (MULTIVITAMIN) tablet Take 1 tablet by mouth daily.  Marland Kitchen omeprazole (PRILOSEC) 20 MG capsule Take 20 mg by mouth daily.  . rosuvastatin (CRESTOR) 20 MG tablet Take 1 tablet (20 mg total) by mouth daily.  . [  DISCONTINUED] rosuvastatin (CRESTOR) 20 MG tablet Take 1 tablet (20 mg total) by mouth daily. Please call to schedule yearly f/u with Dr Excell Seltzer for future refills (1st attempt)     Allergies:   Patient has no known allergies.   Social History   Socioeconomic History  . Marital status: Married    Spouse name: Not on file  . Number of children: 2  . Years of education: Not on file  . Highest education level: Not on file  Occupational History  . Occupation: Event organiser: [employer]  Social Needs  . Financial resource strain: Not on file  . Food insecurity:    Worry: Not on file    Inability: Not on file  . Transportation needs:     Medical: Not on file    Non-medical: Not on file  Tobacco Use  . Smoking status: Never Smoker  . Smokeless tobacco: Never Used  Substance and Sexual Activity  . Alcohol use: No  . Drug use: No  . Sexual activity: Not on file  Lifestyle  . Physical activity:    Days per week: Not on file    Minutes per session: Not on file  . Stress: Not on file  Relationships  . Social connections:    Talks on phone: Not on file    Gets together: Not on file    Attends religious service: Not on file    Active member of club or organization: Not on file    Attends meetings of clubs or organizations: Not on file    Relationship status: Not on file  Other Topics Concern  . Not on file  Social History Narrative  . Not on file     Family History: The patient's family history includes Asthma in her brother; CAD in her brother and brother; Diabetes in her brother; Heart failure in her mother; Hypertension in her brother, brother, brother, brother, brother, father, and mother; Multiple sclerosis in her brother; Peripheral vascular disease in her mother; Stroke in her father. There is no history of Colon cancer.  ROS:   Please see the history of present illness.    Positive for cough, diarrhea, joint swelling.  All other systems reviewed and are negative.  EKGs/Labs/Other Studies Reviewed:    The following studies were reviewed today: Myoview perfusion study 12/16/2016: Study Highlights     Nuclear stress EF: 65%.  Blood pressure demonstrated a normal response to exercise.  Horizontal ST segment depression ST segment depression of 0.5 mm was noted during stress in the III and V6 leadsST deviation beginning in recovery.  This is a low risk study.  Findings consistent with ischemia.  Defect 1: There is a small defect of mild severity present in the basal inferoseptal and mid inferoseptal location.   Low risk stress nuclear study with a small area of inferoseptal ischemia and normal left  ventricular regional and global systolic function. The inferior wall perfusion is normal. Consider a stenosis in a small posterior descending artery. Cannot exclude imaging artifact.   2D echocardiogram 12/16/2016: Study Conclusions  - Left ventricle: The cavity size was normal. Wall thickness was   normal. Systolic function was normal. The estimated ejection   fraction was in the range of 60% to 65%. Wall motion was normal;   there were no regional wall motion abnormalities. Doppler   parameters are consistent with abnormal left ventricular   relaxation (grade 1 diastolic dysfunction). - Aortic valve: There was trivial regurgitation.  Impressions:  -  Normal LV systolic function; mild diastolic dysfunction; mildly   sclerotic aortic valve with trace AI.  EKG:  EKG is ordered today.  The ekg ordered today demonstrates normal sinus rhythm 67 bpm, low voltage QRS, otherwise normal.  Recent Labs: 04/21/2018: ALT 23; BUN 17; Creatinine, Ser 0.68; Hemoglobin 13.8; Platelets 215; Potassium 3.8; Sodium 142; TSH 0.672  Recent Lipid Panel    Component Value Date/Time   CHOL 175 04/21/2018 1247   TRIG 114 04/21/2018 1247   HDL 46 04/21/2018 1247   CHOLHDL 3.8 04/21/2018 1247   LDLCALC 106 (H) 04/21/2018 1247    Physical Exam:    VS:  BP 118/82   Pulse 67   Ht  (1.6 m)   Wt 168 lb (76.2 kg)   SpO2 98%   BMI 29.76 kg/m     Wt Readings from Last 3 Encounters:  06/04/18 168 lb (76.2 kg)  04/21/18 166 lb 6.4 oz (75.5 kg)  07/21/17 166 lb 6.4 oz (75.5 kg)     GEN: Well nourished, well developed in no acute distress HEENT: Normal NECK: No JVD; No carotid bruits LYMPHATICS: No lymphadenopathy CARDIAC: RRR, no murmurs, rubs, gallops RESPIRATORY:  Clear to auscultation without rales, wheezing or rhonchi  ABDOMEN: Soft, non-tender, non-distended MUSCULOSKELETAL:  No edema; No deformity  SKIN: Warm and dry NEUROLOGIC:  Alert and oriented x 3 PSYCHIATRIC:  Normal affect    ASSESSMENT:    1. Atypical chest pain   2. Mixed hyperlipidemia   3. Essential hypertension    PLAN:    In order of problems listed above:  1. The patient has an extensive family history of CAD.  She is managed appropriately with medical therapy.  She continues to have intermittent symptoms of atypical angina.  I have recommended a gated coronary CTA-FFR for further evaluation.  I think this will help both in differentiating her symptoms as well as providing guidance on how intensive her medical therapy needs to be.  If she has significant atherosclerotic burden, I would clearly push her LDL cholesterol goal to less than 70 mg/dL. 2. Lipids reviewed as above.  Await coronary CTA study to determine LDL goal.  Continue rosuvastatin. 3. Blood pressure well controlled on current medical therapy.   Medication Adjustments/Labs and Tests Ordered: Current medicines are reviewed at length with the patient today.  Concerns regarding medicines are outlined above.  Orders Placed This Encounter  Procedures  . CT CORONARY MORPH W/CTA COR W/SCORE W/CA W/CM &/OR WO/CM  . CT CORONARY FRACTIONAL FLOW RESERVE DATA PREP  . CT CORONARY FRACTIONAL FLOW RESERVE FLUID ANALYSIS  . Basic metabolic panel  . EKG 12-Lead   Meds ordered this encounter  Medications  . rosuvastatin (CRESTOR) 20 MG tablet    Sig: Take 1 tablet (20 mg total) by mouth daily.    Dispense:  90 tablet    Refill:  3    Patient Instructions  Medication Instructions:  Your provider recommends that you continue on your current medications as directed. Please refer to the Current Medication list given to you today.   If you need a refill on your cardiac medications before your next appointment, please call your pharmacy.   Lab work: TODAY: BMET If you have labs (blood work) drawn today and your tests are completely normal, you will receive your results only by: Marland Kitchen MyChart Message (if you have MyChart) OR . A paper copy in the  mail If you have any lab test that is abnormal or we  need to change your treatment, we will call you to review the results.  Testing/Procedures: Dr. Excell Seltzer recommends you have a CORONARY CT.  Follow-Up: At [facility], you and your health needs are our priority.  As part of our continuing mission to provide you with exceptional heart care, we have created designated Provider Care Teams.  These Care Teams include your primary Cardiologist (physician) and Advanced Practice Providers (APPs -  Physician Assistants and Nurse Practitioners) who all work together to provide you with the care you need, when you need it. You will need a follow up appointment in:  12 months.  Please call our office two months in advance to schedule this appointment.  You may see Dr. Excell Seltzer or one of the following Advanced Practice Providers on your designated Care Team: Tereso Newcomer, PA-C [place], [place] . Berton Bon, NP     Signed, Tonny Bollman, MD  06/04/2018 12:53 PM    [facility]

## 2018-06-04 NOTE — Patient Instructions (Signed)
Medication Instructions:  Your provider recommends that you continue on your current medications as directed. Please refer to the Current Medication list given to you today.   If you need a refill on your cardiac medications before your next appointment, please call your pharmacy.   Lab work: TODAY: BMET If you have labs (blood work) drawn today and your tests are completely normal, you will receive your results only by: Marland Kitchen MyChart Message (if you have MyChart) OR . A paper copy in the mail If you have any lab test that is abnormal or we need to change your treatment, we will call you to review the results.  Testing/Procedures: Dr. Excell Seltzer recommends you have a CORONARY CT.  Follow-Up: At [facility], you and your health needs are our priority.  As part of our continuing mission to provide you with exceptional heart care, we have created designated Provider Care Teams.  These Care Teams include your primary Cardiologist (physician) and Advanced Practice Providers (APPs -  Physician Assistants and Nurse Practitioners) who all work together to provide you with the care you need, when you need it. You will need a follow up appointment in:  12 months.  Please call our office two months in advance to schedule this appointment.  You may see Dr. Excell Seltzer or one of the following Advanced Practice Providers on your designated Care Team: Tereso Newcomer, PA-C [place], [place] . Berton Bon, NP

## 2018-07-05 ENCOUNTER — Telehealth: Payer: Self-pay | Admitting: Physician Assistant

## 2018-07-05 NOTE — Telephone Encounter (Signed)
Is that acceptable to the employer?  And does her opportunity data be paid get affected by this?  It is okay to do the note if needed.

## 2018-07-05 NOTE — Telephone Encounter (Signed)
ok 

## 2018-07-05 NOTE — Telephone Encounter (Signed)
Patient states she has sick days she can take and that she would contact her employer and they would send over paper work for [employer] to fill out so she can be out another two weeks of work.

## 2018-07-15 NOTE — Telephone Encounter (Signed)
PT is calling to saying that she needs something sent to [place] about her rtn to work, she states that she will return to work on 07/26/2018. She states that she has been self quartined for few weeks and says AJ is aware of this  Please call pt once this has been sent  [facility] [phone]

## 2018-07-16 NOTE — Telephone Encounter (Signed)
Note to be done.

## 2018-07-16 NOTE — Telephone Encounter (Signed)
Okay to do note through 4/26 and RTW 07/26/2018 Pass this to [place].

## 2018-07-16 NOTE — Telephone Encounter (Signed)
Letter written faxed

## 2018-07-19 ENCOUNTER — Telehealth: Payer: Self-pay | Admitting: Physician Assistant

## 2018-07-19 NOTE — Telephone Encounter (Signed)
Refaxed note again per patients request. Patient notified and verbalized understanding

## 2018-08-04 ENCOUNTER — Telehealth: Payer: Self-pay | Admitting: Physician Assistant

## 2018-08-04 NOTE — Telephone Encounter (Signed)
LM needs to sign up for mychart °

## 2018-08-13 ENCOUNTER — Telehealth: Payer: Self-pay | Admitting: Cardiovascular Disease

## 2018-08-13 NOTE — Telephone Encounter (Signed)
Left message to call and schedule cardiac ct/saf

## 2018-08-25 ENCOUNTER — Encounter: Payer: Self-pay | Admitting: Cardiovascular Disease

## 2018-09-30 ENCOUNTER — Telehealth: Payer: Self-pay

## 2018-09-30 ENCOUNTER — Other Ambulatory Visit: Payer: BC Managed Care – PPO | Admitting: *Deleted

## 2018-09-30 ENCOUNTER — Other Ambulatory Visit: Payer: Self-pay

## 2018-09-30 ENCOUNTER — Other Ambulatory Visit: Payer: Self-pay | Admitting: *Deleted

## 2018-09-30 DIAGNOSIS — Z01812 Encounter for preprocedural laboratory examination: Secondary | ICD-10-CM

## 2018-09-30 DIAGNOSIS — R0789 Other chest pain: Secondary | ICD-10-CM

## 2018-09-30 LAB — BASIC METABOLIC PANEL
BUN/Creatinine Ratio: 23 (ref 12–28)
BUN: 19 mg/dL (ref 8–27)
CO2: 25 mmol/L (ref 20–29)
Calcium: 10.2 mg/dL (ref 8.7–10.3)
Chloride: 99 mmol/L (ref 96–106)
Creatinine, Ser: 0.82 mg/dL (ref 0.57–1.00)
GFR calc Af Amer: 89 mL/min/{1.73_m2} (ref >59)
GFR calc non Af Amer: 77 mL/min/{1.73_m2} (ref >59)
Glucose: 94 mg/dL (ref 65–99)
Potassium: 3.9 mmol/L (ref 3.5–5.2)
Sodium: 140 mmol/L (ref 134–144)

## 2018-09-30 MED ORDER — METOPROLOL TARTRATE 100 MG PO TABS: ORAL_TABLET | ORAL | 0 refills | Status: DC

## 2018-09-30 NOTE — Telephone Encounter (Signed)
The patient called today to see if she needs to take Lopressor prior to her CT scan on Monday.  Per CT protocol, called in Lopressor 100 mg to take 1 hour prior to CT scans. She was grateful for assistance.

## 2018-10-01 ENCOUNTER — Telehealth (HOSPITAL_COMMUNITY): Payer: Self-pay | Admitting: *Deleted

## 2018-10-01 NOTE — Telephone Encounter (Signed)
Left message for CT heart 7/6/202 and left callback number

## 2018-10-04 ENCOUNTER — Ambulatory Visit (HOSPITAL_COMMUNITY)
Admission: RE | Admit: 2018-10-04 | Discharge: 2018-10-04 | Disposition: A | Discharge status: Home / Self Care | Payer: BC Managed Care – PPO | Source: Ambulatory Visit | Attending: Cardiovascular Disease | Admitting: Cardiovascular Disease

## 2018-10-04 ENCOUNTER — Encounter: Payer: BC Managed Care – PPO | Admitting: *Deleted

## 2018-10-04 ENCOUNTER — Other Ambulatory Visit: Payer: Self-pay

## 2018-10-04 DIAGNOSIS — R0789 Other chest pain: Secondary | ICD-10-CM | POA: Insufficient documentation

## 2018-10-04 DIAGNOSIS — Z006 Encounter for examination for normal comparison and control in clinical research program: Secondary | ICD-10-CM

## 2018-10-04 DIAGNOSIS — I700 Atherosclerosis of aorta: Secondary | ICD-10-CM

## 2018-10-04 MED ORDER — NITROGLYCERIN 0.4 MG SL SUBL
0.8000 mg | SUBLINGUAL_TABLET | Freq: Once | SUBLINGUAL | Status: AC
  Administered 2018-10-04: 0.8 mg via SUBLINGUAL
  Filled 2018-10-04: qty 25

## 2018-10-04 MED ORDER — IOHEXOL 350 MG/ML SOLN
100.0000 mL | Freq: Once | INTRAVENOUS | Status: AC | PRN
  Administered 2018-10-04: 100 mL via INTRAVENOUS

## 2018-10-04 MED ORDER — NITROGLYCERIN 0.4 MG SL SUBL
SUBLINGUAL_TABLET | SUBLINGUAL | Status: AC
  Filled 2018-10-04: qty 2

## 2018-10-04 NOTE — Research (Signed)
CADFEM Informed Consent                  Subject Name:   Kristen Arroyo. Kristen Arroyo   Subject met inclusion and exclusion criteria.  The informed consent form, study requirements and expectations were reviewed with the subject and questions and concerns were addressed prior to the signing of the consent form.  The subject verbalized understanding of the trial requirements.  The subject agreed to participate in the CADFEM trial and signed the informed consent.  The informed consent was obtained prior to performance of any protocol-specific procedures for the subject.  A copy of the signed informed consent was given to the subject and a copy was placed in the subject's medical record.   [place] Chalmers, Research Assistant  10/04/2018 08:31 a.m.

## 2018-10-04 NOTE — Progress Notes (Signed)
CT scan completed. Tolerated well. D/C home walking, awake and alert, in no distress.

## 2018-10-05 DIAGNOSIS — I700 Atherosclerosis of aorta: Secondary | ICD-10-CM | POA: Diagnosis not present

## 2018-10-05 DIAGNOSIS — R079 Chest pain, unspecified: Secondary | ICD-10-CM | POA: Diagnosis not present

## 2018-10-07 ENCOUNTER — Telehealth: Payer: Self-pay

## 2018-10-07 MED ORDER — EZETIMIBE 10 MG PO TABS: 10.0000 mg | ORAL_TABLET | Freq: Every day | ORAL | 3 refills | Status: DC

## 2018-10-07 NOTE — Telephone Encounter (Signed)
Reviewed results with patient who verbalized understanding.   Instructed patient to START ZETIA 10 mg daily. She will have labs drawn at her PCP in 3 months. Rx will be mailed tomorrow once onsite.  She was grateful for call and agrees with treatment plan.

## 2018-10-07 NOTE — Telephone Encounter (Signed)
-----   Message from Sherren Mocha, MD sent at 10/06/2018  6:56 AM EDT ----- Await CT-FFR. Add Zetia 10 mg to current regimen and repeat lipids 12 weeks. Pt currently on crestor 20 mg with goal LDL < 70 mg/dL, last LDL 106.

## 2018-11-29 ENCOUNTER — Other Ambulatory Visit: Payer: Self-pay | Admitting: Physician Assistant

## 2018-11-29 DIAGNOSIS — F3342 Major depressive disorder, recurrent, in full remission: Secondary | ICD-10-CM

## 2018-12-11 ENCOUNTER — Other Ambulatory Visit: Payer: Self-pay | Admitting: Physician Assistant

## 2018-12-11 DIAGNOSIS — F3342 Major depressive disorder, recurrent, in full remission: Secondary | ICD-10-CM

## 2018-12-22 ENCOUNTER — Other Ambulatory Visit: Payer: Self-pay | Admitting: Physician Assistant

## 2018-12-22 DIAGNOSIS — Z1231 Encounter for screening mammogram for malignant neoplasm of breast: Secondary | ICD-10-CM

## 2019-03-07 ENCOUNTER — Ambulatory Visit
Admission: RE | Admit: 2019-03-07 | Discharge: 2019-03-07 | Disposition: A | Discharge status: Home / Self Care | Payer: BC Managed Care – PPO | Source: Ambulatory Visit | Attending: Physician Assistant | Admitting: Physician Assistant

## 2019-03-07 ENCOUNTER — Other Ambulatory Visit: Payer: Self-pay

## 2019-03-07 DIAGNOSIS — Z1231 Encounter for screening mammogram for malignant neoplasm of breast: Secondary | ICD-10-CM

## 2019-04-19 ENCOUNTER — Other Ambulatory Visit: Payer: Self-pay

## 2019-04-19 ENCOUNTER — Ambulatory Visit: Payer: BC Managed Care – PPO | Attending: Internal Medicine

## 2019-04-19 DIAGNOSIS — Z20822 Contact with and (suspected) exposure to covid-19: Secondary | ICD-10-CM

## 2019-04-20 LAB — NOVEL CORONAVIRUS, NAA: SARS-CoV-2, NAA: NOT DETECTED

## 2019-04-22 ENCOUNTER — Other Ambulatory Visit: Payer: Self-pay

## 2019-04-25 ENCOUNTER — Encounter: Payer: Self-pay | Admitting: Physician Assistant

## 2019-04-25 ENCOUNTER — Ambulatory Visit (INDEPENDENT_AMBULATORY_CARE_PROVIDER_SITE_OTHER): Payer: BC Managed Care – PPO | Admitting: Physician Assistant

## 2019-04-25 ENCOUNTER — Other Ambulatory Visit: Payer: Self-pay

## 2019-04-25 VITALS — BP 116/79 | HR 81 | Temp 98.7°F | Ht 63.0 in | Wt 161.6 lb

## 2019-04-25 DIAGNOSIS — I10 Essential (primary) hypertension: Secondary | ICD-10-CM | POA: Diagnosis not present

## 2019-04-25 DIAGNOSIS — M25549 Pain in joints of unspecified hand: Secondary | ICD-10-CM

## 2019-04-25 DIAGNOSIS — Z0000 Encounter for general adult medical examination without abnormal findings: Secondary | ICD-10-CM | POA: Diagnosis not present

## 2019-04-25 DIAGNOSIS — F3342 Major depressive disorder, recurrent, in full remission: Secondary | ICD-10-CM | POA: Diagnosis not present

## 2019-04-25 MED ORDER — BUPROPION HCL ER (SR) 150 MG PO TB12: 150.0000 mg | ORAL_TABLET | Freq: Every day | ORAL | 3 refills | Status: DC

## 2019-04-25 MED ORDER — DICLOFENAC SODIUM 75 MG PO TBEC: 75.0000 mg | DELAYED_RELEASE_TABLET | Freq: Two times a day (BID) | ORAL | 3 refills | Status: DC

## 2019-04-25 MED ORDER — HYDROCHLOROTHIAZIDE 25 MG PO TABS: 25.0000 mg | ORAL_TABLET | Freq: Every day | ORAL | 3 refills | Status: DC

## 2019-04-25 MED ORDER — ALPRAZOLAM 0.5 MG PO TABS: 0.5000 mg | ORAL_TABLET | Freq: Every evening | ORAL | 1 refills | Status: DC | PRN

## 2019-04-25 MED ORDER — LISINOPRIL 5 MG PO TABS: ORAL_TABLET | ORAL | 3 refills | Status: DC

## 2019-04-25 NOTE — Patient Instructions (Signed)
Health Maintenance, Female Adopting a healthy lifestyle and getting preventive care are important in promoting health and wellness. Ask your health care provider about:  The right schedule for you to have regular tests and exams.  Things you can do on your own to prevent diseases and keep yourself healthy. What should I know about diet, weight, and exercise? Eat a healthy diet   Eat a diet that includes plenty of vegetables, fruits, low-fat dairy products, and lean protein.  Do not eat a lot of foods that are high in solid fats, added sugars, or sodium. Maintain a healthy weight Body mass index (BMI) is used to identify weight problems. It estimates body fat based on height and weight. Your health care provider can help determine your BMI and help you achieve or maintain a healthy weight. Get regular exercise Get regular exercise. This is one of the most important things you can do for your health. Most adults should:  Exercise for at least 150 minutes each week. The exercise should increase your heart rate and make you sweat (moderate-intensity exercise).  Do strengthening exercises at least twice a week. This is in addition to the moderate-intensity exercise.  Spend less time sitting. Even light physical activity can be beneficial. Watch cholesterol and blood lipids Have your blood tested for lipids and cholesterol at 63 years of age, then have this test every 5 years. Have your cholesterol levels checked more often if:  Your lipid or cholesterol levels are high.  You are older than 63 years of age.  You are at high risk for heart disease. What should I know about cancer screening? Depending on your health history and family history, you may need to have cancer screening at various ages. This may include screening for:  Breast cancer.  Cervical cancer.  Colorectal cancer.  Skin cancer.  Lung cancer. What should I know about heart disease, diabetes, and high blood  pressure? Blood pressure and heart disease  High blood pressure causes heart disease and increases the risk of stroke. This is more likely to develop in people who have high blood pressure readings, are of African descent, or are overweight.  Have your blood pressure checked: ? Every 3-5 years if you are 18-39 years of age. ? Every year if you are 40 years old or older. Diabetes Have regular diabetes screenings. This checks your fasting blood sugar level. Have the screening done:  Once every three years after age 40 if you are at a normal weight and have a low risk for diabetes.  More often and at a younger age if you are overweight or have a high risk for diabetes. What should I know about preventing infection? Hepatitis B If you have a higher risk for hepatitis B, you should be screened for this virus. Talk with your health care provider to find out if you are at risk for hepatitis B infection. Hepatitis C Testing is recommended for:  Everyone born from 1945 through 1965.  Anyone with known risk factors for hepatitis C. Sexually transmitted infections (STIs)  Get screened for STIs, including gonorrhea and chlamydia, if: ? You are sexually active and are younger than 63 years of age. ? You are older than 63 years of age and your health care provider tells you that you are at risk for this type of infection. ? Your sexual activity has changed since you were last screened, and you are at increased risk for chlamydia or gonorrhea. Ask your health care provider if   you are at risk.  Ask your health care provider about whether you are at high risk for HIV. Your health care provider may recommend a prescription medicine to help prevent HIV infection. If you choose to take medicine to prevent HIV, you should first get tested for HIV. You should then be tested every 3 months for as long as you are taking the medicine. Pregnancy  If you are about to stop having your period (premenopausal) and  you may become pregnant, seek counseling before you get pregnant.  Take 400 to 800 micrograms (mcg) of folic acid every day if you become pregnant.  Ask for birth control (contraception) if you want to prevent pregnancy. Osteoporosis and menopause Osteoporosis is a disease in which the bones lose minerals and strength with aging. This can result in bone fractures. If you are 65 years old or older, or if you are at risk for osteoporosis and fractures, ask your health care provider if you should:  Be screened for bone loss.  Take a calcium or vitamin D supplement to lower your risk of fractures.  Be given hormone replacement therapy (HRT) to treat symptoms of menopause. Follow these instructions at home: Lifestyle  Do not use any products that contain nicotine or tobacco, such as cigarettes, e-cigarettes, and chewing tobacco. If you need help quitting, ask your health care provider.  Do not use street drugs.  Do not share needles.  Ask your health care provider for help if you need support or information about quitting drugs. Alcohol use  Do not drink alcohol if: ? Your health care provider tells you not to drink. ? You are pregnant, may be pregnant, or are planning to become pregnant.  If you drink alcohol: ? Limit how much you use to 0-1 drink a day. ? Limit intake if you are breastfeeding.  Be aware of how much alcohol is in your drink. In the [place] one drink equals one 12 oz bottle of beer (355 mL), one 5 oz glass of wine (148 mL), or one 1 oz glass of hard liquor (44 mL). General instructions  Schedule regular health, dental, and eye exams.  Stay current with your vaccines.  Tell your health care provider if: ? You often feel depressed. ? You have ever been abused or do not feel safe at home. Summary  Adopting a healthy lifestyle and getting preventive care are important in promoting health and wellness.  Follow your health care provider's instructions about healthy  diet, exercising, and getting tested or screened for diseases.  Follow your health care provider's instructions on monitoring your cholesterol and blood pressure. This information is not intended to replace advice given to you by your health care provider. Make sure you discuss any questions you have with your health care provider. Document Revised: 03/10/2018 Document Reviewed: 03/10/2018 Elsevier Patient Education  2020 [employer]  

## 2019-04-25 NOTE — Progress Notes (Signed)
Subjective:    Patient ID: Kristen Arroyo, female    DOB: Apr 01, 1955, 64 y.o.   MRN: [id number]  Chief Complaint  Patient presents with  . Annual Exam    This patient comes in for annual well physical examination. All medications are reviewed today. There are no reports of any problems with the medications. All of the medical conditions are reviewed and updated.  Lab work is reviewed and will be ordered as medically necessary. There are no new problems reported with today's visit.  Patient reports doing well overall.  Patient does need several labs performed for her insurance runs out.  A couple of the results are the screeners and some are from cardiology.  Overall she states that she has been doing fairly well.  She is excited to be retiring at the end of the week.  She states that her right shoulder is probably the most bothersome of her joints but it only comes and goes is not a permanent thing.    Past Medical History:  Diagnosis Date  . Anxiety   . Arthritis    knees  . GERD (gastroesophageal reflux disease)   . History of seasonal allergies   . Hyperlipidemia   . Hypertension     Past Surgical History:  Procedure Laterality Date  . CARPAL TUNNEL RELEASE  2006   right  . KNEE ARTHROSCOPY     left: torn meniscus    Family History  Problem Relation Age of Onset  . Hypertension Mother   . Peripheral vascular disease Mother   . Heart failure Mother   . Hypertension Father   . Stroke Father        mini strokes  . CAD Brother        CABG at age 90  . Hypertension Brother   . Diabetes Brother   . CAD Brother        Stents in early 60's  . Hypertension Brother   . Multiple sclerosis Brother   . Hypertension Brother   . Asthma Brother   . Hypertension Brother   . Hypertension Brother   . Colon cancer Neg Hx     Social History   Socioeconomic History  . Marital status: Married    Spouse name: Not on file  . Number of children: 2  . Years of  education: Not on file  . Highest education level: Not on file  Occupational History  . Occupation: Best boy: [id number]  Tobacco Use  . Smoking status: Never Smoker  . Smokeless tobacco: Never Used  Substance and Sexual Activity  . Alcohol use: No  . Drug use: No  . Sexual activity: Not on file  Other Topics Concern  . Not on file  Social History Narrative  . Not on file   Social Determinants of Health   Financial Resource Strain:   . Difficulty of Paying Living Expenses: Not on file  Food Insecurity:   . Worried About Charity fundraiser in the Last Year: Not on file  . Ran Out of Food in the Last Year: Not on file  Transportation Needs:   . Lack of Transportation (Medical): Not on file  . Lack of Transportation (Non-Medical): Not on file  Physical Activity:   . Days of Exercise per Week: Not on file  . Minutes of Exercise per Session: Not on file  Stress:   . Feeling of Stress : Not on file  Social Connections:   .  Frequency of Communication with Friends and Family: Not on file  . Frequency of Social Gatherings with Friends and Family: Not on file  . Attends Religious Services: Not on file  . Active Member of Clubs or Organizations: Not on file  . Attends Archivist Meetings: Not on file  . Marital Status: Not on file  Intimate Partner Violence:   . Fear of Current or Ex-Partner: Not on file  . Emotionally Abused: Not on file  . Physically Abused: Not on file  . Sexually Abused: Not on file    Outpatient Medications Prior to Visit  Medication Sig Dispense Refill  . albuterol (PROVENTIL HFA;VENTOLIN HFA) 108 (90 Base) MCG/ACT inhaler Inhale 2 puffs into the lungs every 6 (six) hours as needed for wheezing or shortness of breath. 1 Inhaler 0  . aspirin EC 81 MG tablet Take 1 tablet (81 mg total) by mouth daily.    . calcium-vitamin D (OSCAL WITH D) 500-200 MG-UNIT tablet Take 1 tablet by mouth daily.    . DiphenhydrAMINE HCl (BENADRYL ALLERGY PO)  Take 1 tablet by mouth as needed (ALLERGIES).     Marland Kitchen ezetimibe (ZETIA) 10 MG tablet Take 1 tablet (10 mg total) by mouth daily. 90 tablet 3  . lactase (LACTAID) 3000 units tablet Take 3,000 Units by mouth as needed (STOMACH UPSET).    Marland Kitchen loratadine (CLARITIN) 10 MG tablet Take 1 tablet (10 mg total) by mouth daily. 90 tablet 3  . Multiple Vitamin (MULTIVITAMIN) tablet Take 1 tablet by mouth daily.    Marland Kitchen omeprazole (PRILOSEC) 20 MG capsule Take 20 mg by mouth daily.    . rosuvastatin (CRESTOR) 20 MG tablet Take 1 tablet (20 mg total) by mouth daily. 90 tablet 3  . ALPRAZolam (XANAX) 0.5 MG tablet Take 1 tablet (0.5 mg total) by mouth at bedtime as needed. for sleep 30 tablet 1  . buPROPion (WELLBUTRIN SR) 150 MG 12 hr tablet Take 1 tablet (150 mg total) by mouth daily. 90 tablet 3  . diclofenac (VOLTAREN) 75 MG EC tablet Take 1 tablet (75 mg total) by mouth 2 (two) times daily. 180 tablet 3  . hydrochlorothiazide (HYDRODIURIL) 25 MG tablet Take 1 tablet (25 mg total) by mouth daily. 90 tablet 3  . lisinopril (PRINIVIL,ZESTRIL) 5 MG tablet TAKE 1 TABLET BY MOUTH ONCE DAILY MUST  BE  SEEN  FOR  ADDITIONAL  REFILL 90 tablet 3  . metoprolol tartrate (LOPRESSOR) 100 MG tablet Take one hour prior to cardiac CT. Do not take if your heart rate is less than 60 beats per minute. (Patient not taking: Reported on 04/25/2019) 1 tablet 0   No facility-administered medications prior to visit.    No Known Allergies  Review of Systems  Constitutional: Negative.   HENT: Negative.   Eyes: Negative.   Respiratory: Negative.  Negative for shortness of breath and wheezing.   Cardiovascular: Negative.  Negative for chest pain, palpitations and leg swelling.  Gastrointestinal: Negative.   Genitourinary: Negative.   Musculoskeletal: Positive for arthralgias, joint swelling and myalgias.  Neurological: Negative.        Objective:    Physical Exam Constitutional:      Appearance: She is well-developed.  HENT:       Head: Normocephalic and atraumatic.     Right Ear: Tympanic membrane, ear canal and external ear normal.     Left Ear: Tympanic membrane, ear canal and external ear normal.     Nose: Nose normal. No rhinorrhea.  Mouth/Throat:     Pharynx: No oropharyngeal exudate or posterior oropharyngeal erythema.  Eyes:     Conjunctiva/sclera: Conjunctivae normal.     Pupils: Pupils are equal, round, and reactive to light.  Cardiovascular:     Rate and Rhythm: Normal rate and regular rhythm.     Heart sounds: Normal heart sounds.  Pulmonary:     Effort: Pulmonary effort is normal.     Breath sounds: Normal breath sounds.  Abdominal:     General: Bowel sounds are normal.     Palpations: Abdomen is soft.  Musculoskeletal:     Cervical back: Normal range of motion and neck supple.  Skin:    General: Skin is warm and dry.     Findings: No rash.  Neurological:     Mental Status: She is alert and oriented to person, place, and time.     Deep Tendon Reflexes: Reflexes are normal and symmetric.  Psychiatric:        Behavior: Behavior normal.        Thought Content: Thought content normal.        Judgment: Judgment normal.     BP 116/79   Pulse 81   Temp 98.7 F (37.1 C) (Temporal)   Ht '5\' 3"'  (1.6 m)   Wt 161 lb 9.6 oz (73.3 kg)   SpO2 99%   BMI 28.63 kg/m  Wt Readings from Last 3 Encounters:  04/25/19 161 lb 9.6 oz (73.3 kg)  06/04/18 168 lb (76.2 kg)  04/21/18 166 lb 6.4 oz (75.5 kg)    Health Maintenance Due  Topic Date Due  . Hepatitis C Screening  Aug 06, 1955  . HIV Screening  08/10/1971    There are no preventive care reminders to display for this patient.   Lab Results  Component Value Date   TSH 0.672 04/21/2018   Lab Results  Component Value Date   WBC 5.5 04/21/2018   HGB 13.8 04/21/2018   HCT 38.2 04/21/2018   MCV 90 04/21/2018   PLT 215 04/21/2018   Lab Results  Component Value Date   NA 140 09/30/2018   K 3.9 09/30/2018   CO2 25 09/30/2018    GLUCOSE 94 09/30/2018   BUN 19 09/30/2018   CREATININE 0.82 09/30/2018   BILITOT 0.5 04/21/2018   ALKPHOS 87 04/21/2018   AST 19 04/21/2018   ALT 23 04/21/2018   PROT 7.1 04/21/2018   ALBUMIN 4.5 04/21/2018   CALCIUM 10.2 09/30/2018   Lab Results  Component Value Date   CHOL 175 04/21/2018   Lab Results  Component Value Date   HDL 46 04/21/2018   Lab Results  Component Value Date   LDLCALC 106 (H) 04/21/2018   Lab Results  Component Value Date   TRIG 114 04/21/2018   Lab Results  Component Value Date   CHOLHDL 3.8 04/21/2018   No results found for: HGBA1C     Assessment & Plan:   Problem List Items Addressed This Visit      Cardiovascular and Mediastinum   Essential hypertension   Relevant Medications   hydrochlorothiazide (HYDRODIURIL) 25 MG tablet   lisinopril (ZESTRIL) 5 MG tablet     Other   Recurrent major depressive disorder, in full remission (HCC)   Relevant Medications   ALPRAZolam (XANAX) 0.5 MG tablet   buPROPion (WELLBUTRIN SR) 150 MG 12 hr tablet   Arthralgia of hand   Relevant Medications   diclofenac (VOLTAREN) 75 MG EC tablet    Other Visit Diagnoses  Well adult exam    -  Primary   Relevant Orders   CMP14+EGFR   Lipid panel   Hepatitis C antibody   HIV Antibody (routine testing w rflx)       Meds ordered this encounter  Medications  . ALPRAZolam (XANAX) 0.5 MG tablet    Sig: Take 1 tablet (0.5 mg total) by mouth at bedtime as needed. for sleep    Dispense:  30 tablet    Refill:  1    Order Specific Question:   Supervising Provider    Answer:   Janora Norlander [[id number]]  . buPROPion (WELLBUTRIN SR) 150 MG 12 hr tablet    Sig: Take 1 tablet (150 mg total) by mouth daily.    Dispense:  90 tablet    Refill:  3    Order Specific Question:   Supervising Provider    Answer:   Janora Norlander [[id number]]  . diclofenac (VOLTAREN) 75 MG EC tablet    Sig: Take 1 tablet (75 mg total) by mouth 2 (two) times daily.     Dispense:  180 tablet    Refill:  3    Order Specific Question:   Supervising Provider    Answer:   Janora Norlander [[id number]]  . hydrochlorothiazide (HYDRODIURIL) 25 MG tablet    Sig: Take 1 tablet (25 mg total) by mouth daily.    Dispense:  90 tablet    Refill:  3    Order Specific Question:   Supervising Provider    Answer:   Janora Norlander [[id number]]  . lisinopril (ZESTRIL) 5 MG tablet    Sig: TAKE 1 TABLET BY MOUTH ONCE DAILY MUST  BE  SEEN  FOR  ADDITIONAL  REFILL    Dispense:  90 tablet    Refill:  3    Order Specific Question:   Supervising Provider    Answer:   Janora Norlander [[id number]]     Terald Sleeper, PA-C   Terald Sleeper PA-C [place] [address]  [place], [place] [address] [phone]

## 2019-04-26 LAB — LIPID PANEL
Chol/HDL Ratio: 3.1 ratio (ref 0.0–4.4)
Cholesterol, Total: 151 mg/dL (ref 100–199)
HDL: 49 mg/dL (ref >39)
LDL Chol Calc (NIH): 77 mg/dL (ref 0–99)
Triglycerides: 144 mg/dL (ref 0–149)
VLDL Cholesterol Cal: 25 mg/dL (ref 5–40)

## 2019-04-26 LAB — CMP14+EGFR
ALT: 34 IU/L — ABNORMAL HIGH (ref 0–32)
AST: 28 IU/L (ref 0–40)
Albumin/Globulin Ratio: 1.7 (ref 1.2–2.2)
Albumin: 4.7 g/dL (ref 3.8–4.8)
Alkaline Phosphatase: 81 IU/L (ref 39–117)
BUN/Creatinine Ratio: 12 (ref 12–28)
BUN: 10 mg/dL (ref 8–27)
Bilirubin Total: 0.4 mg/dL (ref 0.0–1.2)
CO2: 26 mmol/L (ref 20–29)
Calcium: 10.2 mg/dL (ref 8.7–10.3)
Chloride: 98 mmol/L (ref 96–106)
Creatinine, Ser: 0.86 mg/dL (ref 0.57–1.00)
GFR calc Af Amer: 84 mL/min/1.73
GFR calc non Af Amer: 73 mL/min/1.73
Globulin, Total: 2.7 g/dL (ref 1.5–4.5)
Glucose: 98 mg/dL (ref 65–99)
Potassium: 3.8 mmol/L (ref 3.5–5.2)
Sodium: 138 mmol/L (ref 134–144)
Total Protein: 7.4 g/dL (ref 6.0–8.5)

## 2019-04-26 LAB — HEPATITIS C ANTIBODY: Hep C Virus Ab: 0.5 s/co ratio (ref 0.0–0.9)

## 2019-04-26 LAB — HIV ANTIBODY (ROUTINE TESTING W REFLEX): HIV Screen 4th Generation wRfx: NONREACTIVE

## 2019-06-06 ENCOUNTER — Other Ambulatory Visit: Payer: Self-pay | Admitting: Cardiovascular Disease

## 2019-08-01 ENCOUNTER — Telehealth: Payer: Self-pay | Admitting: *Deleted

## 2019-08-01 NOTE — Telephone Encounter (Signed)
New appointment letter mailed to patient

## 2019-08-12 ENCOUNTER — Ambulatory Visit (INDEPENDENT_AMBULATORY_CARE_PROVIDER_SITE_OTHER): Payer: 59 | Admitting: Cardiovascular Disease

## 2019-08-12 ENCOUNTER — Other Ambulatory Visit: Payer: Self-pay

## 2019-08-12 ENCOUNTER — Encounter: Payer: Self-pay | Admitting: Cardiovascular Disease

## 2019-08-12 VITALS — BP 142/68 | HR 71 | Ht 63.0 in | Wt 166.4 lb

## 2019-08-12 DIAGNOSIS — I2510 Atherosclerotic heart disease of native coronary artery without angina pectoris: Secondary | ICD-10-CM | POA: Diagnosis not present

## 2019-08-12 DIAGNOSIS — E782 Mixed hyperlipidemia: Secondary | ICD-10-CM

## 2019-08-12 DIAGNOSIS — I10 Essential (primary) hypertension: Secondary | ICD-10-CM

## 2019-08-12 NOTE — Progress Notes (Signed)
Cardiology Office Note:    Date:  08/12/2019   ID:  Kristen Arroyo, DOB 1955-07-07, MRN [id number]  PCP:  Sharion Balloon, FNP  Cardiologist:  Sherren Mocha, MD  Electrophysiologist:  None   Referring MD: Terald Sleeper, PA-C   Chief Complaint  Patient presents with   Coronary Artery Disease    History of Present Illness:    Kristen Arroyo is a 64 y.o. female with a hx of hypertension and hyperlipidemia, presenting for follow-up evaluation.  The patient has a strong family history of coronary artery disease in many of the women on her side of the family.  Her mother, grandmother, and siblings have all had cardiac problems and specifically have had CAD and bypass surgery.  The patient has never been a smoker.  She has been managed with antihypertensive therapy, low-dose aspirin for antiplatelet therapy, and a statin drug. She had a coronary CTA last year which demonstrated moderate mixed LAD plaque with negative CT-FFR evaluation.   She is here alone today. She retired in January and has been busy doing things that she didn't have time to do in the past. She has cleaned out closets and worked in her garden without exertional symptoms. Today, she denies symptoms of palpitations, shortness of breath, orthopnea, PND, lower extremity edema, dizziness, or syncope. She denies exertional chest pain but does have occasional fleeting chest pain at rest. This is longstanding.    Past Medical History:  Diagnosis Date   Anxiety    Arthritis    knees   GERD (gastroesophageal reflux disease)    History of seasonal allergies    Hyperlipidemia    Hypertension     Past Surgical History:  Procedure Laterality Date   CARPAL TUNNEL RELEASE  2006   right   KNEE ARTHROSCOPY     left: torn meniscus    Current Medications: Current Meds  Medication Sig   albuterol (PROVENTIL HFA;VENTOLIN HFA) 108 (90 Base) MCG/ACT inhaler Inhale 2 puffs into the lungs every 6 (six) hours as  needed for wheezing or shortness of breath.   ALPRAZolam (XANAX) 0.5 MG tablet Take 1 tablet (0.5 mg total) by mouth at bedtime as needed. for sleep   aspirin EC 81 MG tablet Take 1 tablet (81 mg total) by mouth daily.   Bioflavonoid Products (ESTER C PO) Take 1,000 mg by mouth daily.   buPROPion (WELLBUTRIN SR) 150 MG 12 hr tablet Take 1 tablet (150 mg total) by mouth daily.   calcium-vitamin D (OSCAL WITH D) 500-200 MG-UNIT tablet Take 1 tablet by mouth daily.   Cholecalciferol (VITAMIN D-3) 125 MCG (5000 UT) TABS Take 5,000 Units by mouth daily.   diclofenac (VOLTAREN) 75 MG EC tablet Take 1 tablet (75 mg total) by mouth 2 (two) times daily.   dimenhyDRINATE (DRAMAMINE) 50 MG tablet Take 50 mg by mouth every 8 (eight) hours as needed.   DiphenhydrAMINE HCl (BENADRYL ALLERGY PO) Take 1 tablet by mouth as needed (ALLERGIES).    ezetimibe (ZETIA) 10 MG tablet Take 1 tablet (10 mg total) by mouth daily.   hydrochlorothiazide (HYDRODIURIL) 25 MG tablet Take 1 tablet (25 mg total) by mouth daily.   lactase (LACTAID) 3000 units tablet Take 3,000 Units by mouth as needed (STOMACH UPSET).   lisinopril (ZESTRIL) 5 MG tablet TAKE 1 TABLET BY MOUTH ONCE DAILY MUST  BE  SEEN  FOR  ADDITIONAL  REFILL   loperamide (IMODIUM A-D) 2 MG tablet Take 2 mg by mouth 4 (  four) times daily as needed for diarrhea or loose stools.   loratadine (CLARITIN) 10 MG tablet Take 1 tablet (10 mg total) by mouth daily.   Multiple Vitamin (MULTIVITAMIN) tablet Take 1 tablet by mouth daily.   omeprazole (PRILOSEC) 20 MG capsule Take 20 mg by mouth daily.   rosuvastatin (CRESTOR) 20 MG tablet Take 1 tablet by mouth once daily   zinc gluconate 50 MG tablet Take 50 mg by mouth daily.     Allergies:   Patient has no known allergies.   Social History   Socioeconomic History   Marital status: Married    Spouse name: Not on file   Number of children: 2   Years of education: Not on file   Highest education  level: Not on file  Occupational History   Occupation: Event organiser: [employer]  Tobacco Use   Smoking status: Never Smoker   Smokeless tobacco: Never Used  Substance and Sexual Activity   Alcohol use: No   Drug use: No   Sexual activity: Not on file  Other Topics Concern   Not on file  Social History Narrative   Not on file   Social Determinants of Health   Financial Resource Strain:    Difficulty of Paying Living Expenses:   Food Insecurity:    Worried About Programme researcher, broadcasting/film/video in the Last Year:    Barista in the Last Year:   Transportation Needs:    Freight forwarder (Medical):    Lack of Transportation (Non-Medical):   Physical Activity:    Days of Exercise per Week:    Minutes of Exercise per Session:   Stress:    Feeling of Stress :   Social Connections:    Frequency of Communication with Friends and Family:    Frequency of Social Gatherings with Friends and Family:    Attends Religious Services:    Active Member of Clubs or Organizations:    Attends Engineer, structural:    Marital Status:      Family History: The patient's family history includes Asthma in her brother; CAD in her brother and brother; Diabetes in her brother; Heart failure in her mother; Hypertension in her brother, brother, brother, brother, brother, father, and mother; Multiple sclerosis in her brother; Peripheral vascular disease in her mother; Stroke in her father. There is no history of Colon cancer.  ROS:   Please see the history of present illness.    All other systems reviewed and are negative.  EKGs/Labs/Other Studies Reviewed:    The following studies were reviewed today: CTA coronary 10/04/2018: IMPRESSION: 1. Coronary calcium score of 634. This was 73 percentile for age and sex matched control.  2. Normal coronary origin with right dominance.  3. Moderate mixed plaque in the proximal LAD with a focal stenosis 50-69%. CAD RADS  3. Additional analysis with CT FFR will be submitted. Aggressive risk factor modification is recommended.  CT-FFR: 1. Left Main:  No significant stenosis.  2. LAD: No significant stenosis. 3. LCX: No significant stenosis. 4. RCA: No significant stenosis.  IMPRESSION: 1.  CT FFR analysis didn't show any significant stenosis.  EKG:  EKG is ordered today.  The ekg ordered today demonstrates NSR 71 bpm, nonspecific ST abnormality, no change since last tracing 06/04/2018.   Recent Labs: 04/25/2019: ALT 34; BUN 10; Creatinine, Ser 0.86; Potassium 3.8; Sodium 138  Recent Lipid Panel    Component Value Date/Time   CHOL 151 04/25/2019  1238   TRIG 144 04/25/2019 1238   HDL 49 04/25/2019 1238   CHOLHDL 3.1 04/25/2019 1238   LDLCALC 77 04/25/2019 1238    Physical Exam:    VS:  BP (!) 142/68    Pulse 71    Ht 5\' 3"  (1.6 m)    Wt 166 lb 6.4 oz (75.5 kg)    SpO2 99%    BMI 29.48 kg/m     Wt Readings from Last 3 Encounters:  08/12/19 166 lb 6.4 oz (75.5 kg)  04/25/19 161 lb 9.6 oz (73.3 kg)  06/04/18 168 lb (76.2 kg)     GEN:  Well nourished, well developed in no acute distress HEENT: Normal NECK: No JVD; No carotid bruits LYMPHATICS: No lymphadenopathy CARDIAC: RRR, no murmurs, rubs, gallops RESPIRATORY:  Clear to auscultation without rales, wheezing or rhonchi  ABDOMEN: Soft, non-tender, non-distended MUSCULOSKELETAL:  No edema; No deformity  SKIN: Warm and dry NEUROLOGIC:  Alert and oriented x 3 PSYCHIATRIC:  Normal affect   ASSESSMENT:    1. Coronary artery disease involving native coronary artery of native heart without angina pectoris   2. Essential hypertension   3. Mixed hyperlipidemia    PLAN:    In order of problems listed above:  1. The patient is stable without any symptoms of angina.  She is on appropriate risk reduction medical program with aspirin, rosuvastatin, Zetia.  Stressed the importance of taking her aspirin on a regular basis as she admits to missing  it frequently.  She has had some concerns about GI side effects.  We reviewed the pros and cons of low-dose aspirin today and I think with her strong family history of coronary artery disease it is in her benefit to take this on a daily basis. 2. Blood pressure is controlled on lisinopril and hydrochlorothiazide. 3. Lipids are reviewed with cholesterol 151, HDL 49, LDL 77.  Continue current therapy.   Medication Adjustments/Labs and Tests Ordered: Current medicines are reviewed at length with the patient today.  Concerns regarding medicines are outlined above.  Orders Placed This Encounter  Procedures   EKG 12-Lead   No orders of the defined types were placed in this encounter.   Patient Instructions  Medication Instructions:  Your provider recommends that you continue on your current medications as directed. Please refer to the Current Medication list given to you today.   *If you need a refill on your cardiac medications before your next appointment, please call your pharmacy*   Follow-Up: At [facility], you and your health needs are our priority.  As part of our continuing mission to provide you with exceptional heart care, we have created designated Provider Care Teams.  These Care Teams include your primary Cardiologist (physician) and Advanced Practice Providers (APPs -  Physician Assistants and Nurse Practitioners) who all work together to provide you with the care you need, when you need it. Your next appointment:   12 month(s) The format for your next appointment:   In Person Provider:   You may see [facility], MD or one of the following Advanced Practice Providers on your designated Care Team:    Tonny Bollman, PA-C  Tereso Newcomer, Chelsea Aus      Signed, [place], MD  08/12/2019 11:24 AM    [facility]

## 2019-08-12 NOTE — Patient Instructions (Signed)

## 2019-09-05 ENCOUNTER — Other Ambulatory Visit: Payer: Self-pay | Admitting: Cardiovascular Disease

## 2019-10-12 ENCOUNTER — Other Ambulatory Visit: Payer: Self-pay | Admitting: Cardiovascular Disease

## 2019-10-24 ENCOUNTER — Ambulatory Visit: Payer: BC Managed Care – PPO | Admitting: Nurse Practitioner

## 2019-10-24 ENCOUNTER — Ambulatory Visit: Payer: BC Managed Care – PPO | Admitting: Physician Assistant

## 2019-10-25 ENCOUNTER — Ambulatory Visit: Payer: 59 | Admitting: Family

## 2019-10-25 ENCOUNTER — Encounter: Payer: Self-pay | Admitting: Family

## 2019-10-25 ENCOUNTER — Other Ambulatory Visit: Payer: Self-pay

## 2019-10-25 VITALS — BP 122/74 | HR 82 | Temp 98.1°F | Ht 63.0 in | Wt 166.0 lb

## 2019-10-25 DIAGNOSIS — E782 Mixed hyperlipidemia: Secondary | ICD-10-CM | POA: Diagnosis not present

## 2019-10-25 DIAGNOSIS — K219 Gastro-esophageal reflux disease without esophagitis: Secondary | ICD-10-CM

## 2019-10-25 DIAGNOSIS — I10 Essential (primary) hypertension: Secondary | ICD-10-CM

## 2019-10-25 DIAGNOSIS — F3342 Major depressive disorder, recurrent, in full remission: Secondary | ICD-10-CM | POA: Diagnosis not present

## 2019-10-25 DIAGNOSIS — F411 Generalized anxiety disorder: Secondary | ICD-10-CM

## 2019-10-25 DIAGNOSIS — Z79899 Other long term (current) drug therapy: Secondary | ICD-10-CM

## 2019-10-25 DIAGNOSIS — J3089 Other allergic rhinitis: Secondary | ICD-10-CM

## 2019-10-25 MED ORDER — ALPRAZOLAM 0.5 MG PO TABS: 0.5000 mg | ORAL_TABLET | Freq: Every evening | ORAL | 1 refills | Status: DC | PRN

## 2019-10-25 NOTE — Progress Notes (Signed)
Subjective:    Patient ID: Kristen Arroyo, female    DOB: 1955-07-19, 64 y.o.   MRN: [id number]  Chief Complaint  Patient presents with  . Medical Management of Chronic Issues    6 mth Kristen Arroyo patient, allergies acting up   Pt presents to the office today to establish care. She is followed by Cardiologists  annually for angina, HTN and family hx of CAD,  Hypertension This is a chronic problem. The current episode started more than 1 year ago. The problem has been resolved since onset. The problem is controlled. Associated symptoms include anxiety and palpitations. Pertinent negatives include no malaise/fatigue, peripheral edema or shortness of breath. Risk factors for coronary artery disease include obesity and sedentary lifestyle. The current treatment provides moderate improvement. There is no history of CAD/MI or heart failure.  Gastroesophageal Reflux She complains of belching and heartburn. This is a chronic problem. The current episode started more than 1 year ago. The problem occurs occasionally. The problem has been waxing and waning. The symptoms are aggravated by certain foods. Risk factors include obesity. She has tried a PPI for the symptoms. The treatment provided moderate relief.  Depression        This is a chronic problem.  The current episode started more than 1 year ago.   The onset quality is gradual.   The problem occurs intermittently.  Associated symptoms include irritable, restlessness and sad.  Associated symptoms include no helplessness and no hopelessness.  Past medical history includes anxiety.   Anxiety Presents for follow-up visit. Symptoms include depressed mood, irritability, nervous/anxious behavior, palpitations and restlessness. Patient reports no shortness of breath. Symptoms occur occasionally. The severity of symptoms is moderate. The quality of sleep is good.    Hyperlipidemia This is a chronic problem. The current episode started more than 1 year ago.  The problem is controlled. Recent lipid tests were reviewed and are normal. Pertinent negatives include no shortness of breath. Current antihyperlipidemic treatment includes statins and ezetimibe. The current treatment provides moderate improvement of lipids. Risk factors for coronary artery disease include dyslipidemia, hypertension and a sedentary lifestyle.      Review of Systems  Constitutional: Positive for irritability. Negative for malaise/fatigue.  Respiratory: Negative for shortness of breath.   Cardiovascular: Positive for palpitations.  Gastrointestinal: Positive for heartburn.  Psychiatric/Behavioral: Positive for depression. The patient is nervous/anxious.   All other systems reviewed and are negative.      Objective:   Physical Exam Vitals reviewed.  Constitutional:      General: She is irritable. She is not in acute distress.    Appearance: She is well-developed.  HENT:     Head: Normocephalic and atraumatic.     Right Ear: Tympanic membrane normal.     Left Ear: Tympanic membrane normal.  Eyes:     Pupils: Pupils are equal, round, and reactive to light.  Neck:     Thyroid: No thyromegaly.  Cardiovascular:     Rate and Rhythm: Normal rate and regular rhythm.     Heart sounds: Normal heart sounds. No murmur heard.   Pulmonary:     Effort: Pulmonary effort is normal. No respiratory distress.     Breath sounds: Normal breath sounds. No wheezing.  Abdominal:     General: Bowel sounds are normal. There is no distension.     Palpations: Abdomen is soft.     Tenderness: There is no abdominal tenderness.  Musculoskeletal:        General: No  tenderness. Normal range of motion.     Cervical back: Normal range of motion and neck supple.  Skin:    General: Skin is warm and dry.  Neurological:     Mental Status: She is alert and oriented to person, place, and time.     Cranial Nerves: No cranial nerve deficit.     Deep Tendon Reflexes: Reflexes are normal and  symmetric.  Psychiatric:        Behavior: Behavior normal.        Thought Content: Thought content normal.        Judgment: Judgment normal.     BP 122/74   Pulse 82   Temp 98.1 F (36.7 C) (Temporal)   Ht _0  (1.6 m)   Wt 166 lb (75.3 kg)   SpO2 98%   BMI 29.41 kg/m      Assessment & Plan:  Kristen Arroyo comes in today with chief complaint of Medical Management of Chronic Issues (6 mth Kristen Arroyo patient, allergies acting up)   Diagnosis and orders addressed:  1. Recurrent major depressive disorder, in full remission ([place]) - CMP14+EGFR - CBC with Differential/Platelet  2. Gastroesophageal reflux disease without esophagitis - CMP14+EGFR - CBC with Differential/Platelet  3. Mixed hyperlipidemia - CMP14+EGFR - CBC with Differential/Platelet  4. Essential hypertension - CMP14+EGFR - CBC with Differential/Platelet  5. Chronic nonseasonal allergic rhinitis due to pollen - CMP14+EGFR - CBC with Differential/Platelet  6. Controlled substance agreement signed - Tox[employer]SU[employer] Select 13 (MW), Urine - ALPRAZolam (XANAX) 0.5 MG tablet; Take 1 tablet (0.5 mg total) by mouth at bedtime as needed. for sleep  Dispense: 20 tablet; Refill: 1 - CMP14+EGFR - CBC with Differential/Platelet  7. GAD (generalized anxiety disorder) Patient reviewed in [place] controlled database, no flags noted. Contract and drug screen up dated today - ToxASSURE Select 13 (MW), Urine - ALPRAZolam (XANAX) 0.5 MG tablet; Take 1 tablet (0.5 mg total) by mouth at bedtime as needed. for sleep  Dispense: 20 tablet; Refill: 1 - CMP14+EGFR - CBC with Differential/Platelet   Labs pending Health Maintenance reviewed Diet and exercise encouraged  Follow up plan: 6 months    Kristen Dun, FNP

## 2019-10-25 NOTE — Patient Instructions (Signed)

## 2019-10-26 LAB — CBC WITH DIFFERENTIAL/PLATELET
Basophils Absolute: 0 10*3/uL (ref 0.0–0.2)
Basos: 0 %
EOS (ABSOLUTE): 0.2 10*3/uL (ref 0.0–0.4)
Eos: 3 %
Hematocrit: 39.6 % (ref 34.0–46.6)
Hemoglobin: 13.6 g/dL (ref 11.1–15.9)
Immature Grans (Abs): 0 10*3/uL (ref 0.0–0.1)
Immature Granulocytes: 0 %
Lymphocytes Absolute: 1.2 10*3/uL (ref 0.7–3.1)
Lymphs: 18 %
MCH: 31.6 pg (ref 26.6–33.0)
MCHC: 34.3 g/dL (ref 31.5–35.7)
MCV: 92 fL (ref 79–97)
Monocytes Absolute: 0.5 10*3/uL (ref 0.1–0.9)
Monocytes: 7 %
Neutrophils Absolute: 4.8 10*3/uL (ref 1.4–7.0)
Neutrophils: 72 %
Platelets: 256 10*3/uL (ref 150–450)
RBC: 4.3 x10E6/uL (ref 3.77–5.28)
RDW: 12 % (ref 11.7–15.4)
WBC: 6.7 10*3/uL (ref 3.4–10.8)

## 2019-10-26 LAB — CMP14+EGFR
ALT: 30 IU/L (ref 0–32)
AST: 26 IU/L (ref 0–40)
Albumin/Globulin Ratio: 2.3 — ABNORMAL HIGH (ref 1.2–2.2)
Albumin: 5.2 g/dL — ABNORMAL HIGH (ref 3.8–4.8)
Alkaline Phosphatase: 88 IU/L (ref 48–121)
BUN/Creatinine Ratio: 23 (ref 12–28)
BUN: 23 mg/dL (ref 8–27)
Bilirubin Total: 0.3 mg/dL (ref 0.0–1.2)
CO2: 25 mmol/L (ref 20–29)
Calcium: 10.2 mg/dL (ref 8.7–10.3)
Chloride: 99 mmol/L (ref 96–106)
Creatinine, Ser: 0.98 mg/dL (ref 0.57–1.00)
GFR calc Af Amer: 71 mL/min/{1.73_m2} (ref >59)
GFR calc non Af Amer: 62 mL/min/{1.73_m2} (ref >59)
Globulin, Total: 2.3 g/dL (ref 1.5–4.5)
Glucose: 89 mg/dL (ref 65–99)
Potassium: 3.9 mmol/L (ref 3.5–5.2)
Sodium: 137 mmol/L (ref 134–144)
Total Protein: 7.5 g/dL (ref 6.0–8.5)

## 2019-10-27 LAB — TOXASSURE SELECT 13 (MW), URINE

## 2020-01-25 ENCOUNTER — Other Ambulatory Visit: Payer: Self-pay | Admitting: Family

## 2020-01-25 DIAGNOSIS — Z1231 Encounter for screening mammogram for malignant neoplasm of breast: Secondary | ICD-10-CM

## 2020-03-07 ENCOUNTER — Other Ambulatory Visit: Payer: Self-pay

## 2020-03-07 ENCOUNTER — Ambulatory Visit
Admission: RE | Admit: 2020-03-07 | Discharge: 2020-03-07 | Disposition: A | Discharge status: Home / Self Care | Payer: 59 | Source: Ambulatory Visit | Attending: Family | Admitting: Family

## 2020-03-07 DIAGNOSIS — Z1231 Encounter for screening mammogram for malignant neoplasm of breast: Secondary | ICD-10-CM

## 2020-03-15 ENCOUNTER — Other Ambulatory Visit: Payer: Self-pay

## 2020-03-15 DIAGNOSIS — I10 Essential (primary) hypertension: Secondary | ICD-10-CM

## 2020-05-21 ENCOUNTER — Encounter: Payer: Self-pay | Admitting: Family

## 2020-05-21 ENCOUNTER — Ambulatory Visit (INDEPENDENT_AMBULATORY_CARE_PROVIDER_SITE_OTHER): Payer: 59 | Admitting: Family

## 2020-05-21 ENCOUNTER — Other Ambulatory Visit (HOSPITAL_COMMUNITY)
Admission: RE | Admit: 2020-05-21 | Discharge: 2020-05-21 | Disposition: A | Discharge status: Home / Self Care | Payer: 59 | Source: Ambulatory Visit | Attending: Family | Admitting: Family

## 2020-05-21 ENCOUNTER — Other Ambulatory Visit: Payer: Self-pay

## 2020-05-21 VITALS — BP 135/78 | HR 75 | Temp 97.3°F | Ht 63.0 in | Wt 167.8 lb

## 2020-05-21 DIAGNOSIS — Z01419 Encounter for gynecological examination (general) (routine) without abnormal findings: Secondary | ICD-10-CM

## 2020-05-21 DIAGNOSIS — F411 Generalized anxiety disorder: Secondary | ICD-10-CM | POA: Diagnosis not present

## 2020-05-21 DIAGNOSIS — F3342 Major depressive disorder, recurrent, in full remission: Secondary | ICD-10-CM

## 2020-05-21 DIAGNOSIS — Z0000 Encounter for general adult medical examination without abnormal findings: Secondary | ICD-10-CM | POA: Insufficient documentation

## 2020-05-21 DIAGNOSIS — Z0001 Encounter for general adult medical examination with abnormal findings: Secondary | ICD-10-CM | POA: Diagnosis not present

## 2020-05-21 DIAGNOSIS — Z79899 Other long term (current) drug therapy: Secondary | ICD-10-CM | POA: Diagnosis not present

## 2020-05-21 DIAGNOSIS — I10 Essential (primary) hypertension: Secondary | ICD-10-CM

## 2020-05-21 DIAGNOSIS — M25549 Pain in joints of unspecified hand: Secondary | ICD-10-CM

## 2020-05-21 DIAGNOSIS — K219 Gastro-esophageal reflux disease without esophagitis: Secondary | ICD-10-CM

## 2020-05-21 DIAGNOSIS — E782 Mixed hyperlipidemia: Secondary | ICD-10-CM

## 2020-05-21 MED ORDER — ALBUTEROL SULFATE HFA 108 (90 BASE) MCG/ACT IN AERS: 2.0000 | INHALATION_SPRAY | Freq: Four times a day (QID) | RESPIRATORY_TRACT | 0 refills | Status: AC | PRN

## 2020-05-21 MED ORDER — ALPRAZOLAM 0.5 MG PO TABS: 0.5000 mg | ORAL_TABLET | Freq: Every evening | ORAL | 1 refills | Status: DC | PRN

## 2020-05-21 MED ORDER — LISINOPRIL 5 MG PO TABS: ORAL_TABLET | ORAL | 3 refills | Status: DC

## 2020-05-21 MED ORDER — HYDROCHLOROTHIAZIDE 25 MG PO TABS: 25.0000 mg | ORAL_TABLET | Freq: Every day | ORAL | 3 refills | Status: DC

## 2020-05-21 MED ORDER — DICLOFENAC SODIUM 75 MG PO TBEC: 75.0000 mg | DELAYED_RELEASE_TABLET | Freq: Two times a day (BID) | ORAL | 3 refills | Status: DC

## 2020-05-21 MED ORDER — ROSUVASTATIN CALCIUM 20 MG PO TABS: 20.0000 mg | ORAL_TABLET | Freq: Every day | ORAL | 3 refills | Status: DC

## 2020-05-21 MED ORDER — BUPROPION HCL ER (SR) 150 MG PO TB12: 150.0000 mg | ORAL_TABLET | Freq: Every day | ORAL | 3 refills | Status: DC

## 2020-05-21 NOTE — Progress Notes (Signed)
Subjective:    Patient ID: Kristen Arroyo, female    DOB: 03/27/1956, 65 y.o.   MRN: [id number]  Chief Complaint  Patient presents with  . Annual Exam    With pap    Pt presents to the office today for CPE with pap. She is followed by Cardiologists  annually for angina, HTN and family hx of CAD,  Hypertension This is a chronic problem. The current episode started more than 1 year ago. The problem has been resolved since onset. The problem is controlled. Associated symptoms include anxiety and peripheral edema ("some times at the end of the day"). Pertinent negatives include no malaise/fatigue or shortness of breath. Risk factors for coronary artery disease include dyslipidemia, diabetes mellitus, obesity and sedentary lifestyle. The current treatment provides moderate improvement.  Gastroesophageal Reflux She complains of belching and heartburn. This is a chronic problem. The current episode started more than 1 year ago. The problem occurs occasionally. The problem has been waxing and waning. Risk factors include obesity. She has tried a PPI for the symptoms. The treatment provided moderate relief.  Depression        This is a chronic problem.  The current episode started more than 1 year ago.   The onset quality is gradual.   The problem occurs intermittently.  Associated symptoms include irritable, restlessness and sad.  Associated symptoms include no helplessness and no hopelessness.  Past medical history includes anxiety.   Hyperlipidemia This is a chronic problem. The current episode started more than 1 year ago. Exacerbating diseases include obesity. Pertinent negatives include no shortness of breath. Current antihyperlipidemic treatment includes ezetimibe and statins. The current treatment provides moderate improvement of lipids. Risk factors for coronary artery disease include dyslipidemia, diabetes mellitus, hypertension, a sedentary lifestyle and post-menopausal.  Anxiety Presents  for follow-up visit. Symptoms include excessive worry, irritability, nervous/anxious behavior and restlessness. Patient reports no shortness of breath. Symptoms occur occasionally. The quality of sleep is good.        Review of Systems  Constitutional: Positive for irritability. Negative for malaise/fatigue.  Respiratory: Negative for shortness of breath.   Gastrointestinal: Positive for heartburn.  Psychiatric/Behavioral: Positive for depression. The patient is nervous/anxious.   All other systems reviewed and are negative.      Family History  Problem Relation Age of Onset  . Hypertension Mother   . Peripheral vascular disease Mother   . Heart failure Mother   . Hypertension Father   . Stroke Father        mini strokes  . CAD Brother        CABG at age 16  . Hypertension Brother   . Diabetes Brother   . CAD Brother        Stents in early 60's  . Hypertension Brother   . Multiple sclerosis Brother   . Hypertension Brother   . Asthma Brother   . Hypertension Brother   . Hypertension Brother   . Colon cancer Neg Hx    Social History   Socioeconomic History  . Marital status: Married    Spouse name: Not on file  . Number of children: 2  . Years of education: Not on file  . Highest education level: Not on file  Occupational History  . Occupation: Best boy: [employer]  Tobacco Use  . Smoking status: Never Smoker  . Smokeless tobacco: Never Used  Vaping Use  . Vaping Use: Never used  Substance and Sexual Activity  .  Alcohol use: No  . Drug use: No  . Sexual activity: Not on file  Other Topics Concern  . Not on file  Social History Narrative  . Not on file   Social Determinants of Health   Financial Resource Strain: Not on file  Food Insecurity: Not on file  Transportation Needs: Not on file  Physical Activity: Not on file  Stress: Not on file  Social Connections: Not on file    Objective:   Physical Exam Vitals reviewed.  Constitutional:       General: She is irritable. She is not in acute distress.    Appearance: She is well-developed and well-nourished.  HENT:     Head: Normocephalic and atraumatic.     Right Ear: Tympanic membrane normal.     Left Ear: Tympanic membrane normal.     Mouth/Throat:     Mouth: Oropharynx is clear and moist.  Eyes:     Pupils: Pupils are equal, round, and reactive to light.  Neck:     Thyroid: No thyromegaly.  Cardiovascular:     Rate and Rhythm: Normal rate and regular rhythm.     Pulses: Intact distal pulses.     Heart sounds: Normal heart sounds. No murmur heard.   Pulmonary:     Effort: Pulmonary effort is normal. No respiratory distress.     Breath sounds: Normal breath sounds. No wheezing.  Chest:  Breasts:     Right: No swelling, bleeding, inverted nipple, mass, nipple discharge, skin change or tenderness.     Left: No swelling, bleeding, inverted nipple, mass, nipple discharge, skin change or tenderness.    Abdominal:     General: Bowel sounds are normal. There is no distension.     Palpations: Abdomen is soft.     Tenderness: There is no abdominal tenderness.  Genitourinary:    Comments: Bimanual exam- no adnexal masses or tenderness, ovaries nonpalpable   Cervix parous and pink- No discharge  Musculoskeletal:        General: No tenderness or edema. Normal range of motion.     Cervical back: Normal range of motion and neck supple.  Skin:    General: Skin is warm and dry.  Neurological:     Mental Status: She is alert and oriented to person, place, and time.     Cranial Nerves: No cranial nerve deficit.     Deep Tendon Reflexes: Reflexes are normal and symmetric.  Psychiatric:        Mood and Affect: Mood and affect normal.        Behavior: Behavior normal.        Thought Content: Thought content normal.        Judgment: Judgment normal.       BP 135/78   Pulse 75   Temp (!) 97.3 F (36.3 C) (Temporal)   Ht '5\' 3"'  (1.6 m)   Wt 167 lb 12.8 oz (76.1 kg)    BMI 29.72 kg/m      Assessment & Plan:  JAHNAE MCADOO comes in today with chief complaint of Annual Exam (With pap)   Diagnosis and orders addressed:  1. Arthralgia of hand, unspecified laterality  - diclofenac (VOLTAREN) 75 MG EC tablet; Take 1 tablet (75 mg total) by mouth 2 (two) times daily.  Dispense: 180 tablet; Refill: 3 - CMP14+EGFR - CBC with Differential/Platelet  2. Controlled substance agreement signed - ALPRAZolam (XANAX) 0.5 MG tablet; Take 1 tablet (0.5 mg total) by mouth at bedtime as  needed. for sleep  Dispense: 20 tablet; Refill: 1 - CMP14+EGFR - CBC with Differential/Platelet  3. GAD (generalized anxiety disorder) - ALPRAZolam (XANAX) 0.5 MG tablet; Take 1 tablet (0.5 mg total) by mouth at bedtime as needed. for sleep  Dispense: 20 tablet; Refill: 1 - CMP14+EGFR - CBC with Differential/Platelet  4. Essential hypertension - lisinopril (ZESTRIL) 5 MG tablet; TAKE 1 TABLET BY MOUTH ONCE DAILY MUST  BE  SEEN  FOR  ADDITIONAL  REFILL  Dispense: 90 tablet; Refill: 3 - hydrochlorothiazide (HYDRODIURIL) 25 MG tablet; Take 1 tablet (25 mg total) by mouth daily.  Dispense: 90 tablet; Refill: 3 - CMP14+EGFR - CBC with Differential/Platelet  5. Recurrent major depressive disorder, in full remission ([place]) - buPROPion (WELLBUTRIN SR) 150 MG 12 hr tablet; Take 1 tablet (150 mg total) by mouth daily.  Dispense: 90 tablet; Refill: 3 - CMP14+EGFR - CBC with Differential/Platelet  6. Annual physical exam - CMP14+EGFR - CBC with Differential/Platelet - Lipid panel - TSH - Cytology - PAP([place])  7. Encounter for gynecological examination without abnormal finding - CMP14+EGFR - CBC with Differential/Platelet - Cytology - PAP([place])  8. Gastroesophageal reflux disease without esophagitis - CMP14+EGFR - CBC with Differential/Platelet  9. Mixed hyperlipidemia - CMP14+EGFR - CBC with Differential/Platelet - Lipid panel   Labs pending Patient  reviewed in [place] controlled database, no flags noted. Contract and drug screen are up to date.  Health Maintenance reviewed Diet and exercise encouraged  Follow up plan: 6 months    [name], FNP

## 2020-05-21 NOTE — Patient Instructions (Signed)
Health Maintenance, Female Adopting a healthy lifestyle and getting preventive care are important in promoting health and wellness. Ask your health care provider about:  The right schedule for you to have regular tests and exams.  Things you can do on your own to prevent diseases and keep yourself healthy. What should I know about diet, weight, and exercise? Eat a healthy diet  Eat a diet that includes plenty of vegetables, fruits, low-fat dairy products, and lean protein.  Do not eat a lot of foods that are high in solid fats, added sugars, or sodium.   Maintain a healthy weight Body mass index (BMI) is used to identify weight problems. It estimates body fat based on height and weight. Your health care provider can help determine your BMI and help you achieve or maintain a healthy weight. Get regular exercise Get regular exercise. This is one of the most important things you can do for your health. Most adults should:  Exercise for at least 150 minutes each week. The exercise should increase your heart rate and make you sweat (moderate-intensity exercise).  Do strengthening exercises at least twice a week. This is in addition to the moderate-intensity exercise.  Spend less time sitting. Even light physical activity can be beneficial. Watch cholesterol and blood lipids Have your blood tested for lipids and cholesterol at 64 years of age, then have this test every 5 years. Have your cholesterol levels checked more often if:  Your lipid or cholesterol levels are high.  You are older than 64 years of age.  You are at high risk for heart disease. What should I know about cancer screening? Depending on your health history and family history, you may need to have cancer screening at various ages. This may include screening for:  Breast cancer.  Cervical cancer.  Colorectal cancer.  Skin cancer.  Lung cancer. What should I know about heart disease, diabetes, and high blood  pressure? Blood pressure and heart disease  High blood pressure causes heart disease and increases the risk of stroke. This is more likely to develop in people who have high blood pressure readings, are of African descent, or are overweight.  Have your blood pressure checked: ? Every 3-5 years if you are 18-39 years of age. ? Every year if you are 40 years old or older. Diabetes Have regular diabetes screenings. This checks your fasting blood sugar level. Have the screening done:  Once every three years after age 40 if you are at a normal weight and have a low risk for diabetes.  More often and at a younger age if you are overweight or have a high risk for diabetes. What should I know about preventing infection? Hepatitis B If you have a higher risk for hepatitis B, you should be screened for this virus. Talk with your health care provider to find out if you are at risk for hepatitis B infection. Hepatitis C Testing is recommended for:  Everyone born from 1945 through 1965.  Anyone with known risk factors for hepatitis C. Sexually transmitted infections (STIs)  Get screened for STIs, including gonorrhea and chlamydia, if: ? You are sexually active and are younger than 64 years of age. ? You are older than 64 years of age and your health care provider tells you that you are at risk for this type of infection. ? Your sexual activity has changed since you were last screened, and you are at increased risk for chlamydia or gonorrhea. Ask your health care provider   if you are at risk.  Ask your health care provider about whether you are at high risk for HIV. Your health care provider may recommend a prescription medicine to help prevent HIV infection. If you choose to take medicine to prevent HIV, you should first get tested for HIV. You should then be tested every 3 months for as long as you are taking the medicine. Pregnancy  If you are about to stop having your period (premenopausal) and  you may become pregnant, seek counseling before you get pregnant.  Take 400 to 800 micrograms (mcg) of folic acid every day if you become pregnant.  Ask for birth control (contraception) if you want to prevent pregnancy. Osteoporosis and menopause Osteoporosis is a disease in which the bones lose minerals and strength with aging. This can result in bone fractures. If you are 65 years old or older, or if you are at risk for osteoporosis and fractures, ask your health care provider if you should:  Be screened for bone loss.  Take a calcium or vitamin D supplement to lower your risk of fractures.  Be given hormone replacement therapy (HRT) to treat symptoms of menopause. Follow these instructions at home: Lifestyle  Do not use any products that contain nicotine or tobacco, such as cigarettes, e-cigarettes, and chewing tobacco. If you need help quitting, ask your health care provider.  Do not use street drugs.  Do not share needles.  Ask your health care provider for help if you need support or information about quitting drugs. Alcohol use  Do not drink alcohol if: ? Your health care provider tells you not to drink. ? You are pregnant, may be pregnant, or are planning to become pregnant.  If you drink alcohol: ? Limit how much you use to 0-1 drink a day. ? Limit intake if you are breastfeeding.  Be aware of how much alcohol is in your drink. In the [place] one drink equals one 12 oz bottle of beer (355 mL), one 5 oz glass of wine (148 mL), or one 1 oz glass of hard liquor (44 mL). General instructions  Schedule regular health, dental, and eye exams.  Stay current with your vaccines.  Tell your health care provider if: ? You often feel depressed. ? You have ever been abused or do not feel safe at home. Summary  Adopting a healthy lifestyle and getting preventive care are important in promoting health and wellness.  Follow your health care provider's instructions about healthy  diet, exercising, and getting tested or screened for diseases.  Follow your health care provider's instructions on monitoring your cholesterol and blood pressure. This information is not intended to replace advice given to you by your health care provider. Make sure you discuss any questions you have with your health care provider. Document Revised: 03/10/2018 Document Reviewed: 03/10/2018 Elsevier Patient Education  2021 [employer]  

## 2020-05-22 LAB — CMP14+EGFR
ALT: 33 IU/L — ABNORMAL HIGH (ref 0–32)
AST: 29 IU/L (ref 0–40)
Albumin/Globulin Ratio: 1.7 (ref 1.2–2.2)
Albumin: 4.7 g/dL (ref 3.8–4.8)
Alkaline Phosphatase: 92 IU/L (ref 44–121)
BUN/Creatinine Ratio: 15 (ref 12–28)
BUN: 13 mg/dL (ref 8–27)
Bilirubin Total: 0.3 mg/dL (ref 0.0–1.2)
CO2: 26 mmol/L (ref 20–29)
Calcium: 10 mg/dL (ref 8.7–10.3)
Chloride: 101 mmol/L (ref 96–106)
Creatinine, Ser: 0.85 mg/dL (ref 0.57–1.00)
GFR calc Af Amer: 84 mL/min/{1.73_m2} (ref >59)
GFR calc non Af Amer: 73 mL/min/{1.73_m2} (ref >59)
Globulin, Total: 2.7 g/dL (ref 1.5–4.5)
Glucose: 96 mg/dL (ref 65–99)
Potassium: 4.7 mmol/L (ref 3.5–5.2)
Sodium: 142 mmol/L (ref 134–144)
Total Protein: 7.4 g/dL (ref 6.0–8.5)

## 2020-05-22 LAB — CBC WITH DIFFERENTIAL/PLATELET
Basophils Absolute: 0 10*3/uL (ref 0.0–0.2)
Basos: 1 %
EOS (ABSOLUTE): 0.2 10*3/uL (ref 0.0–0.4)
Eos: 4 %
Hematocrit: 39.5 % (ref 34.0–46.6)
Hemoglobin: 13.5 g/dL (ref 11.1–15.9)
Immature Grans (Abs): 0 10*3/uL (ref 0.0–0.1)
Immature Granulocytes: 0 %
Lymphocytes Absolute: 1 10*3/uL (ref 0.7–3.1)
Lymphs: 17 %
MCH: 31.4 pg (ref 26.6–33.0)
MCHC: 34.2 g/dL (ref 31.5–35.7)
MCV: 92 fL (ref 79–97)
Monocytes Absolute: 0.4 10*3/uL (ref 0.1–0.9)
Monocytes: 7 %
Neutrophils Absolute: 4.3 10*3/uL (ref 1.4–7.0)
Neutrophils: 71 %
Platelets: 227 10*3/uL (ref 150–450)
RBC: 4.3 x10E6/uL (ref 3.77–5.28)
RDW: 11.7 % (ref 11.7–15.4)
WBC: 6 10*3/uL (ref 3.4–10.8)

## 2020-05-22 LAB — CYTOLOGY - PAP: Diagnosis: NEGATIVE

## 2020-05-22 LAB — LIPID PANEL
Chol/HDL Ratio: 2.9 ratio (ref 0.0–4.4)
Cholesterol, Total: 161 mg/dL (ref 100–199)
HDL: 55 mg/dL (ref >39)
LDL Chol Calc (NIH): 83 mg/dL (ref 0–99)
Triglycerides: 130 mg/dL (ref 0–149)
VLDL Cholesterol Cal: 23 mg/dL (ref 5–40)

## 2020-05-22 LAB — TSH: TSH: 1.47 u[IU]/mL (ref 0.450–4.500)

## 2020-08-23 ENCOUNTER — Ambulatory Visit (INDEPENDENT_AMBULATORY_CARE_PROVIDER_SITE_OTHER): Payer: 59 | Admitting: Family

## 2020-08-23 ENCOUNTER — Other Ambulatory Visit: Payer: Self-pay

## 2020-08-23 ENCOUNTER — Encounter: Payer: Self-pay | Admitting: Family

## 2020-08-23 VITALS — BP 124/75 | HR 77 | Temp 98.1°F | Ht 63.0 in | Wt 166.4 lb

## 2020-08-23 DIAGNOSIS — G5701 Lesion of sciatic nerve, right lower limb: Secondary | ICD-10-CM

## 2020-08-23 MED ORDER — PREDNISONE 10 MG (21) PO TBPK
ORAL_TABLET | ORAL | 0 refills | Status: DC
Start: 2020-08-23 — End: 2021-05-23

## 2020-08-23 MED ORDER — DICLOFENAC SODIUM 75 MG PO TBEC
75.0000 mg | DELAYED_RELEASE_TABLET | Freq: Two times a day (BID) | ORAL | 3 refills | Status: DC
Start: 2020-08-23 — End: 2021-05-23

## 2020-08-23 MED ORDER — BACLOFEN 10 MG PO TABS: 10.0000 mg | ORAL_TABLET | Freq: Three times a day (TID) | ORAL | 0 refills | Status: DC

## 2020-08-23 NOTE — Patient Instructions (Signed)
Piriformis Syndrome  Piriformis syndrome is a condition that can cause pain and numbness in your buttocks and down the back of your leg. Piriformis syndrome happens when the small muscle that connects the base of your spine to your hip (piriformis muscle) presses on the nerve that runs down the back of your leg (sciatic nerve). The piriformis muscle helps your hip rotate and helps to bring your leg back and out. It also helps shift your weight to keep you stable while you are walking. The sciatic nerve runs under or through the piriformis muscle. Damage to the piriformis muscle can cause spasms that put pressure on the nerve below. This causes pain and discomfort while sitting and moving. The pain may feel as if it begins in the buttock and spreads (radiates) down your hip and thigh. What are the causes? This condition is caused by pressure on the sciatic nerve from the piriformis muscle. The piriformis muscle can get irritated with overuse, especially if other hip muscles are weak and the piriformis muscle has to do extra work. Piriformis syndrome can also occur after an injury, like a fall onto your buttocks. What increases the risk? You are more likely to develop this condition if you:  Are a woman.  Sit for long periods of time.  Are a cyclist.  Have weak buttocks muscles (gluteal muscles). What are the signs or symptoms? Symptoms of this condition include:  Pain, tingling, or numbness that starts in the buttock and runs down the back of your leg (sciatica).  Pain in the groin or thigh area. Your symptoms may get worse:  The longer you sit.  When you walk, run, or climb stairs.  When straining to have a bowel movement. How is this diagnosed? This condition is diagnosed based on your symptoms, medical history, and physical exam.  During the exam, your health care provider may: ? Move your leg into different positions to check for pain. ? Press on the muscles of your hip and  buttock to see if that increases your symptoms.  You may also have tests, including: ? Imaging tests such as X-rays, MRI, or ultrasound. ? Electromyogram (EMG). This test measures electrical signals sent by your nerves into the muscles. ? Nerve conduction study. This test measures how well electrical signals pass through your nerves. How is this treated? This condition may be treated by:  Stopping all activities that cause pain or make your condition worse.  Applying ice or using heat therapy.  Taking medicines to reduce pain and swelling.  Taking a muscle relaxer (muscle relaxant) to stop muscle spasms.  Doing range-of-motion and strengthening exercises (physical therapy) as told by your health care provider.  Massaging the area.  Having acupuncture.  Getting an injection of medicine in the piriformis muscle. Your health care provider will choose the medicine based on your condition. He or she may inject: ? An anti-inflammatory medicine (steroid) to reduce swelling. ? A numbing medicine (local anesthetic) to block the pain. ? Botulinum toxin. The toxin blocks nerve impulses to specific muscles to reduce muscle tension. In rare cases, you may need surgery to cut the muscle and release pressure on the nerve if other treatments do not work. Follow these instructions at home: Activity  Do not sit for long periods. Get up and walk around every 20 minutes or as often as told by your health care provider. ? When driving long distances, make sure to take frequent stops to get up and stretch.  Use a   cushion when you sit on hard surfaces.  Do exercises as told by your health care provider.  Return to your normal activities as told by your health care provider. Ask your health care provider what activities are safe for you. Managing pain, stiffness, and swelling  If directed, apply heat to the affected area as often as told by your health care provider. Use the heat source that your  health care provider recommends, such as a moist heat pack or a heating pad. ? Place a towel between your skin and the heat source. ? Leave the heat on for 20-30 minutes. ? Remove the heat if your skin turns bright red. This is especially important if you are unable to feel pain, heat, or cold. You may have a greater risk of getting burned.  If directed, put ice on the injured area. ? Put ice in a plastic bag. ? Place a towel between your skin and the bag. ? Leave the ice on for 20 minutes, 2-3 times a day.      General instructions  Take over-the-counter and prescription medicines only as told by your health care provider.  Ask your health care provider if the medicine prescribed to you requires you to avoid driving or using heavy machinery.  You may need to take actions to prevent or treat constipation, such as: ? Drink enough fluid to keep your urine pale yellow. ? Take over-the-counter or prescription medicines. ? Eat foods that are high in fiber, such as beans, whole grains, and fresh fruits and vegetables. ? Limit foods that are high in fat and processed sugars, such as fried or sweet foods.  Keep all follow-up visits as told by your health care provider. This is important. How is this prevented?  Do not sit for longer than 20 minutes at a time. When you sit, choose padded surfaces.  Warm up and stretch before being active.  Cool down and stretch after being active.  Give your body time to rest between periods of activity.  Make sure to use equipment that fits you.  Maintain physical fitness, including: ? Strength. ? Flexibility. Contact a health care provider if:  Your pain and stiffness continue or get worse.  Your leg or hip becomes weak.  You have changes in your bowel function or bladder function. Summary  Piriformis syndrome is a condition that can cause pain, tingling, and numbness in your buttocks and down the back of your leg.  You may try applying  heat or ice to relieve the pain.  Do not sit for long periods. Get up and walk around every 20 minutes or as often as told by your health care provider. This information is not intended to replace advice given to you by your health care provider. Make sure you discuss any questions you have with your health care provider. Document Revised: 07/08/2018 Document Reviewed: 11/11/2017 Elsevier Patient Education  2021 Elsevier Inc.  

## 2020-08-23 NOTE — Progress Notes (Signed)
Subjective:    Patient ID: Kristen Arroyo, female    DOB: [birth year], 65 y.o.   MRN: [id number]  Chief Complaint  Patient presents with  . Back Pain    Low back pain and will run down leg and tingle past couple of months. Hurts worse when sits    Pt presents to the office today with right gluteal pain that has been on and off over the last few months. She reports when she sits it is worse.   Back Pain This is a new problem. The current episode started more than 1 month ago. The problem occurs intermittently. The problem has been waxing and waning since onset. The pain is present in the gluteal. The quality of the pain is described as aching. The pain radiates to the right thigh. The pain is at a severity of 7/10. The pain is moderate. The symptoms are aggravated by sitting. Associated symptoms include numbness and tingling. Pertinent negatives include no weakness. She has tried nothing for the symptoms. The treatment provided no relief.      Review of Systems  Musculoskeletal: Positive for back pain.  Neurological: Positive for tingling and numbness. Negative for weakness.  All other systems reviewed and are negative.      Objective:   Physical Exam Vitals reviewed.  Constitutional:      General: She is not in acute distress.    Appearance: She is well-developed.  HENT:     Head: Normocephalic and atraumatic.  Eyes:     Pupils: Pupils are equal, round, and reactive to light.  Neck:     Thyroid: No thyromegaly.  Cardiovascular:     Rate and Rhythm: Normal rate and regular rhythm.     Heart sounds: Normal heart sounds. No murmur heard.   Pulmonary:     Effort: Pulmonary effort is normal. No respiratory distress.     Breath sounds: Normal breath sounds. No wheezing.  Abdominal:     General: Bowel sounds are normal. There is no distension.     Palpations: Abdomen is soft.     Tenderness: There is no abdominal tenderness.  Musculoskeletal:        General: No  tenderness. Normal range of motion.     Cervical back: Normal range of motion and neck supple.       Legs:     Comments: Tingling numbness pain present in right gluteal, full ROM, negative SLR  Skin:    General: Skin is warm and dry.  Neurological:     Mental Status: She is alert and oriented to person, place, and time.     Cranial Nerves: No cranial nerve deficit.     Deep Tendon Reflexes: Reflexes are normal and symmetric.  Psychiatric:        Behavior: Behavior normal.        Thought Content: Thought content normal.        Judgment: Judgment normal.      BP 124/75   Pulse 77   Temp 98.1 F (36.7 C) (Temporal)   Ht 5\' 3"  (1.6 m)   Wt 166 lb 6.4 oz (75.5 kg)   BMI 29.48 kg/m       Assessment & Plan:  [name] comes in today with chief complaint of Back Pain (Low back pain and will run down leg and tingle past couple of months. Hurts worse when sits )   Diagnosis and orders addressed:  1. Piriformis syndrome of right side -Rest -Ice- ROM  exercises -Restart diclofenac BID  -No other NSAID's RTO if symptoms worsen or do not improve  - predniSONE (STERAPRED UNI-PAK 21 TAB) 10 MG (21) TBPK tablet; Use as directed  Dispense: 21 tablet; Refill: 0 - diclofenac (VOLTAREN) 75 MG EC tablet; Take 1 tablet (75 mg total) by mouth 2 (two) times daily.  Dispense: 180 tablet; Refill: 3 - baclofen (LIORESAL) 10 MG tablet; Take 1 tablet (10 mg total) by mouth 3 (three) times daily.  Dispense: 30 each; Refill: 0     Jannifer Rodney, FNP

## 2020-10-03 ENCOUNTER — Other Ambulatory Visit: Payer: Self-pay | Admitting: Cardiovascular Disease

## 2020-10-16 NOTE — Progress Notes (Signed)
Cardiology Office Note:    Date:  10/17/2020   ID:  Kristen Arroyo, DOB [birth year], MRN [id number]  PCP:  Kristen Spencer, FNP   [facility] Providers Cardiologist:  Tonny Bollman, MD     Referring MD: Kristen Spencer, FNP   Chief Complaint:  Follow-up (CAD)    Patient Profile:    Kristen Arroyo is a 65 y.o. female with:  Coronary artery disease  Non-obs by CT in 2020 Hypertension  Hyperlipidemia  FHx of CAD   Prior CV studies: CT CORONARY MORPH W/CTA COR W/SCORE W/CA W/CM &/OR WO/CM 10/04/2018 Aorta: Normal size with maximum diameter of the ascending aorta measuring 36 mm. Mild diffuse atherosclerotic plaque and calcifications. No dissection.  IMPRESSION: 1. Coronary calcium score of 634. This was 40 percentile for age and sex matched control.  2. Normal coronary origin with right dominance.  3. Moderate mixed plaque in the proximal LAD with a focal stenosis 50-69%. CAD RADS 3. Additional analysis with CT FFR will be submitted. Aggressive risk factor modification is recommended.  IMPRESSION: 1.  Aortic Atherosclerosis (ICD10-I70.0).  Echocardiogram 12/16/16 EF 60-65, normal wall motion, G1 DD, trivial AI  GATED SPECT MYO PERF W/EXERCISE STRESS 1D 12/16/2016 Narrative  Nuclear stress EF: 65%. Low risk stress nuclear study with a small area of inferoseptal ischemia and normal left ventricular regional and global systolic function. The inferior wall perfusion is normal. Consider a stenosis in a small posterior descending artery. Cannot exclude imaging artifact.    History of Present Illness: Kristen Arroyo was last seen by Dr. Excell Seltzer in 07/2019.  She returns for f/u.  She is here alone.  Overall, she has been doing well.  She has not had significant shortness of breath.  She has not had orthopnea, syncope, significant leg edema.  She does have some dependent pedal edema.  This resolves with elevation.  She has an occasional brief chest pain.  She sometimes has  some jaw pain.  This happens every several months and seems to be related to emotional stress.  She has not had exertional symptoms.  She has not had a change in symptoms.    Past Medical History:  Diagnosis Date   Anxiety    Arthritis    knees   GERD (gastroesophageal reflux disease)    History of seasonal allergies    Hyperlipidemia    Hypertension     Current Medications: Current Meds  Medication Sig   albuterol (VENTOLIN HFA) 108 (90 Base) MCG/ACT inhaler Inhale 2 puffs into the lungs every 6 (six) hours as needed for wheezing or shortness of breath.   ALPRAZolam (XANAX) 0.5 MG tablet Take 1 tablet (0.5 mg total) by mouth at bedtime as needed. for sleep   aspirin EC 81 MG tablet Take 1 tablet (81 mg total) by mouth daily.   baclofen (LIORESAL) 10 MG tablet Take 10 mg by mouth as needed for muscle spasms.   Bioflavonoid Products (ESTER C PO) Take 1,000 mg by mouth daily.   buPROPion (WELLBUTRIN SR) 150 MG 12 hr tablet Take 1 tablet (150 mg total) by mouth daily.   calcium-vitamin D (OSCAL WITH D) 500-200 MG-UNIT tablet Take 1 tablet by mouth daily.   Cholecalciferol (VITAMIN D-3) 125 MCG (5000 UT) TABS Take 5,000 Units by mouth daily.   diclofenac (VOLTAREN) 75 MG EC tablet Take 1 tablet (75 mg total) by mouth 2 (two) times daily.   dimenhyDRINATE (DRAMAMINE) 50 MG tablet Take 50 mg by mouth every 8 (  eight) hours as needed.   ezetimibe (ZETIA) 10 MG tablet Take 1 tablet by mouth once daily   hydrochlorothiazide (HYDRODIURIL) 25 MG tablet Take 1 tablet (25 mg total) by mouth daily.   lactase (LACTAID) 3000 units tablet Take 3,000 Units by mouth as needed (STOMACH UPSET).   lisinopril (ZESTRIL) 5 MG tablet TAKE 1 TABLET BY MOUTH ONCE DAILY MUST  BE  SEEN  FOR  ADDITIONAL  REFILL   loperamide (IMODIUM A-D) 2 MG tablet Take 2 mg by mouth 4 (four) times daily as needed for diarrhea or loose stools.   loratadine (CLARITIN) 10 MG tablet Take 1 tablet (10 mg total) by mouth daily.    Multiple Vitamin (MULTIVITAMIN) tablet Take 1 tablet by mouth daily.   omeprazole (PRILOSEC) 20 MG capsule Take 20 mg by mouth daily.   predniSONE (STERAPRED UNI-PAK 21 TAB) 10 MG (21) TBPK tablet Use as directed   rosuvastatin (CRESTOR) 20 MG tablet Take 1 tablet (20 mg total) by mouth daily.   zinc gluconate 50 MG tablet Take 50 mg by mouth daily.     Allergies:   Patient has no known allergies.   Social History   Tobacco Use   Smoking status: Never   Smokeless tobacco: Never  Vaping Use   Vaping Use: Never used  Substance Use Topics   Alcohol use: No   Drug use: No     Family Hx: The patient's family history includes Asthma in her brother; CAD in her brother and brother; Diabetes in her brother; Heart failure in her mother; Hypertension in her brother, brother, brother, brother, brother, father, and mother; Multiple sclerosis in her brother; Peripheral vascular disease in her mother; Stroke in her father. There is no history of Colon cancer.  Review of Systems  Cardiovascular:  Negative for claudication.    EKGs/Labs/Other Test Reviewed:    EKG:  EKG is  ordered today.  The ekg ordered today demonstrates normal sinus rhythm, heart rate 66, normal axis, no ST-T wave changes, low voltage, QTC 448, no change from prior tracing  Recent Labs: 05/21/2020: ALT 33; BUN 13; Creatinine, Ser 0.85; Hemoglobin 13.5; Platelets 227; Potassium 4.7; Sodium 142; TSH 1.470   Recent Lipid Panel Lab Results  Component Value Date/Time   CHOL 161 05/21/2020 09:59 AM   TRIG 130 05/21/2020 09:59 AM   HDL 55 05/21/2020 09:59 AM   LDLCALC 83 05/21/2020 09:59 AM      Risk Assessment/Calculations:      Physical Exam:    VS:  BP 128/80   Pulse 66   Ht 5\' 3"  (1.6 m)   Wt 168 lb 6.4 oz (76.4 kg)   SpO2 98%   BMI 29.83 kg/m     Wt Readings from Last 3 Encounters:  10/17/20 168 lb 6.4 oz (76.4 kg)  08/23/20 166 lb 6.4 oz (75.5 kg)  05/21/20 167 lb 12.8 oz (76.1 kg)     Constitutional:       Appearance: Healthy appearance. Not in distress.  Neck:     Vascular: No carotid bruit. JVD normal.  Pulmonary:     Effort: Pulmonary effort is normal.     Breath sounds: No wheezing. No rales.  Cardiovascular:     Normal rate. Regular rhythm. Normal S1. Normal S2.      Murmurs: There is no murmur.  Edema:    Peripheral edema absent.  Abdominal:     Palpations: Abdomen is soft. There is no hepatomegaly.  Skin:    General:  Skin is warm and dry.  Neurological:     Mental Status: Alert and oriented to person, place and time.     Cranial Nerves: Cranial nerves are intact.        ASSESSMENT & PLAN:    1. Coronary artery disease involving native coronary artery of native heart without angina pectoris Coronary CTA in 09/2018 with high calcium score and nonobstructive plaque in the LAD.  She has noted occasional chest pain and jaw pain.  This is fairly infrequent and she has noted it for quite some time.  We discussed +/- stress testing.  We ultimately decided to hold off on stress testing unless she has a change in her symptoms.  She knows to contact me if she does not I will arrange a Lexiscan Myoview.  Continue aspirin, statin.  Follow-up in 1 year.  2. Essential hypertension The patient's blood pressure is controlled on her current regimen.  Continue current dose of lisinopril, hydrochlorothiazide.   3. Mixed hyperlipidemia Recent LDL 83.  I would shoot for a goal of <70.  Arrange repeat fasting lipids and LFTs in the next 2 months.  If her LDL remains above 70, I will increase her rosuvastatin to 40 mg.     Shared Decision Making/Informed Consent The risks [chest pain, shortness of breath, cardiac arrhythmias, dizziness, blood pressure fluctuations, myocardial infarction, stroke/transient ischemic attack, nausea, vomiting, allergic reaction, radiation exposure, metallic taste sensation and life-threatening complications (estimated to be 1 in 10,000)], benefits (risk  stratification, diagnosing coronary artery disease, treatment guidance) and alternatives of a nuclear stress test were discussed in detail with Kristen Arroyo.  She knows to contact me if her symptoms should change that we can arrange testing.   Dispo:  Return in about 1 year (around 10/17/2021) for Routine follow up in 1 year with Dr. Excell Seltzer or Tereso Newcomer, PA-C..   Medication Adjustments/Labs and Tests Ordered: Current medicines are reviewed at length with the patient today.  Concerns regarding medicines are outlined above.  Tests Ordered: Orders Placed This Encounter  Procedures   EKG 12-Lead   Medication Changes: No orders of the defined types were placed in this encounter.   Signed, Tereso Newcomer, PA-C  10/17/2020 5:38 PM    [facility] [address] [place], [place], [place]  [address] Phone: [phone]; Fax: [phone]

## 2020-10-17 ENCOUNTER — Other Ambulatory Visit: Payer: Self-pay

## 2020-10-17 ENCOUNTER — Encounter: Payer: Self-pay | Admitting: Physician Assistant

## 2020-10-17 ENCOUNTER — Ambulatory Visit (INDEPENDENT_AMBULATORY_CARE_PROVIDER_SITE_OTHER): Payer: 59 | Admitting: Physician Assistant

## 2020-10-17 VITALS — BP 128/80 | HR 66 | Ht 63.0 in | Wt 168.4 lb

## 2020-10-17 DIAGNOSIS — I2510 Atherosclerotic heart disease of native coronary artery without angina pectoris: Secondary | ICD-10-CM

## 2020-10-17 DIAGNOSIS — I10 Essential (primary) hypertension: Secondary | ICD-10-CM

## 2020-10-17 DIAGNOSIS — E782 Mixed hyperlipidemia: Secondary | ICD-10-CM | POA: Diagnosis not present

## 2020-10-17 NOTE — Patient Instructions (Signed)
Medication Instructions:   Your physician recommends that you continue on your current medications as directed. Please refer to the Current Medication list given to you today.  *If you need a refill on your cardiac medications before your next appointment, please call your pharmacy*  Lab Work:  Your physician recommends that you return for a FASTING lipid profile AND LFT at your PCP doctor, patient was given a script today with fax number.   If you have labs (blood work) drawn today and your tests are completely normal, you will receive your results only by: MyChart Message (if you have MyChart) OR A paper copy in the mail If you have any lab test that is abnormal or we need to change your treatment, we will call you to review the results.  Testing/Procedures:  -NONE  Follow-Up: At [facility], you and your health needs are our priority.  As part of our continuing mission to provide you with exceptional heart care, we have created designated Provider Care Teams.  These Care Teams include your primary Cardiologist (physician) and Advanced Practice Providers (APPs -  Physician Assistants and Nurse Practitioners) who all work together to provide you with the care you need, when you need it.  We recommend signing up for the patient portal called "MyChart".  Sign up information is provided on this After Visit Summary.  MyChart is used to connect with patients for Virtual Visits (Telemedicine).  Patients are able to view lab/test results, encounter notes, upcoming appointments, etc.  Non-urgent messages can be sent to your provider as well.   To learn more about what you can do with MyChart, go to [email or web address].    Your next appointment:   1 year(s)  The format for your next appointment:   In Person  Provider:   You may see Tonny Bollman, MD or one of the following Advanced Practice Providers on your designated Care Team:   Tereso Newcomer, PA-C  Other Instructions  Your  physician wants you to follow-up in: 1 year with Dr. Excell Seltzer or Tereso Newcomer, PA-C.  You will receive a reminder letter in the mail two months in advance. If you don't receive a letter, please call our office to schedule the follow-up appointment.  Please call office at [phone] or send my chart message if you want a stress test.

## 2020-12-30 ENCOUNTER — Other Ambulatory Visit: Payer: Self-pay | Admitting: Cardiovascular Disease

## 2021-02-06 ENCOUNTER — Other Ambulatory Visit: Payer: Self-pay | Admitting: Family

## 2021-02-06 DIAGNOSIS — Z1231 Encounter for screening mammogram for malignant neoplasm of breast: Secondary | ICD-10-CM

## 2021-02-07 ENCOUNTER — Telehealth: Payer: Self-pay | Admitting: Cardiovascular Disease

## 2021-02-07 ENCOUNTER — Other Ambulatory Visit: Payer: 59

## 2021-02-07 DIAGNOSIS — E782 Mixed hyperlipidemia: Secondary | ICD-10-CM

## 2021-02-07 DIAGNOSIS — Z79899 Other long term (current) drug therapy: Secondary | ICD-10-CM

## 2021-02-07 NOTE — Telephone Encounter (Signed)
Pt is at [facility] now to have labs done... lab states that they are needing an actual [facility] requisition faxed over to [phone]... please advise

## 2021-02-07 NOTE — Telephone Encounter (Signed)
Orders placed and released for Lipid panel and LFT's per Natale Lay last note 09/2020.  [facility] at [facility] notified.

## 2021-02-07 NOTE — Telephone Encounter (Signed)
Attempted phone call to both pt phone numbers and left voicemail message to contact [phone].

## 2021-02-08 LAB — LIPID PANEL
Chol/HDL Ratio: 2.9 ratio (ref 0.0–4.4)
Cholesterol, Total: 153 mg/dL (ref 100–199)
HDL: 52 mg/dL (ref >39)
LDL Chol Calc (NIH): 81 mg/dL (ref 0–99)
Triglycerides: 108 mg/dL (ref 0–149)
VLDL Cholesterol Cal: 20 mg/dL (ref 5–40)

## 2021-02-08 LAB — HEPATIC FUNCTION PANEL
ALT: 50 IU/L — ABNORMAL HIGH (ref 0–32)
AST: 36 IU/L (ref 0–40)
Albumin: 4.8 g/dL (ref 3.8–4.8)
Alkaline Phosphatase: 100 IU/L (ref 44–121)
Bilirubin Total: 0.4 mg/dL (ref 0.0–1.2)
Bilirubin, Direct: 0.12 mg/dL (ref 0.00–0.40)
Total Protein: 7.3 g/dL (ref 6.0–8.5)

## 2021-02-11 ENCOUNTER — Other Ambulatory Visit: Payer: Self-pay | Admitting: *Deleted

## 2021-02-11 DIAGNOSIS — E782 Mixed hyperlipidemia: Secondary | ICD-10-CM

## 2021-02-11 DIAGNOSIS — Z79899 Other long term (current) drug therapy: Secondary | ICD-10-CM

## 2021-02-11 MED ORDER — ROSUVASTATIN CALCIUM 40 MG PO TABS: 40.0000 mg | ORAL_TABLET | Freq: Every day | ORAL | 3 refills | Status: DC

## 2021-02-12 ENCOUNTER — Other Ambulatory Visit (HOSPITAL_BASED_OUTPATIENT_CLINIC_OR_DEPARTMENT_OTHER): Payer: Self-pay | Admitting: *Deleted

## 2021-02-12 DIAGNOSIS — E782 Mixed hyperlipidemia: Secondary | ICD-10-CM

## 2021-02-12 DIAGNOSIS — Z79899 Other long term (current) drug therapy: Secondary | ICD-10-CM

## 2021-02-13 ENCOUNTER — Other Ambulatory Visit (HOSPITAL_BASED_OUTPATIENT_CLINIC_OR_DEPARTMENT_OTHER): Payer: Self-pay | Admitting: *Deleted

## 2021-02-13 DIAGNOSIS — Z79899 Other long term (current) drug therapy: Secondary | ICD-10-CM

## 2021-03-12 ENCOUNTER — Ambulatory Visit
Admission: RE | Admit: 2021-03-12 | Discharge: 2021-03-12 | Disposition: A | Discharge status: Home / Self Care | Payer: 59 | Source: Ambulatory Visit | Attending: Family | Admitting: Family

## 2021-03-12 DIAGNOSIS — Z1231 Encounter for screening mammogram for malignant neoplasm of breast: Secondary | ICD-10-CM

## 2021-03-26 ENCOUNTER — Other Ambulatory Visit: Payer: Self-pay | Admitting: Cardiovascular Disease

## 2021-04-08 NOTE — Telephone Encounter (Signed)
-----   Message from Kristen Arroyo, [place] sent at 03/26/2021  2:02 PM EST ----- She was supposed to have f/u LFTs earlier this month after adjusting meds for lipids in Nov. Can we check to see if she had these done somewhere else? I don't see any in [facility] or Care Everywhere.   Thanks [employer]

## 2021-04-08 NOTE — Telephone Encounter (Signed)
Lvm with pt and mychart message to see if pt ever had repeat lipids due to increase in crestor in November.

## 2021-04-09 ENCOUNTER — Other Ambulatory Visit: Payer: Self-pay

## 2021-04-09 ENCOUNTER — Other Ambulatory Visit: Payer: 59

## 2021-04-09 DIAGNOSIS — Z79899 Other long term (current) drug therapy: Secondary | ICD-10-CM | POA: Diagnosis not present

## 2021-04-10 ENCOUNTER — Other Ambulatory Visit: Payer: Self-pay | Admitting: *Deleted

## 2021-04-10 DIAGNOSIS — Z79899 Other long term (current) drug therapy: Secondary | ICD-10-CM

## 2021-04-10 DIAGNOSIS — E782 Mixed hyperlipidemia: Secondary | ICD-10-CM

## 2021-04-10 LAB — HEPATIC FUNCTION PANEL
ALT: 28 IU/L (ref 0–32)
AST: 25 IU/L (ref 0–40)
Albumin: 4.5 g/dL (ref 3.8–4.8)
Alkaline Phosphatase: 89 IU/L (ref 44–121)
Bilirubin Total: 0.4 mg/dL (ref 0.0–1.2)
Bilirubin, Direct: 0.14 mg/dL (ref 0.00–0.40)
Total Protein: 6.9 g/dL (ref 6.0–8.5)

## 2021-05-15 ENCOUNTER — Other Ambulatory Visit: Payer: Self-pay | Admitting: Family

## 2021-05-15 DIAGNOSIS — I10 Essential (primary) hypertension: Secondary | ICD-10-CM

## 2021-05-23 ENCOUNTER — Ambulatory Visit (INDEPENDENT_AMBULATORY_CARE_PROVIDER_SITE_OTHER): Payer: 59 | Admitting: Family

## 2021-05-23 ENCOUNTER — Encounter: Payer: Self-pay | Admitting: Family

## 2021-05-23 VITALS — BP 130/81 | HR 80 | Temp 97.4°F | Ht 63.0 in | Wt 166.6 lb

## 2021-05-23 DIAGNOSIS — I10 Essential (primary) hypertension: Secondary | ICD-10-CM | POA: Diagnosis not present

## 2021-05-23 DIAGNOSIS — F411 Generalized anxiety disorder: Secondary | ICD-10-CM | POA: Diagnosis not present

## 2021-05-23 DIAGNOSIS — E782 Mixed hyperlipidemia: Secondary | ICD-10-CM | POA: Diagnosis not present

## 2021-05-23 DIAGNOSIS — Z0001 Encounter for general adult medical examination with abnormal findings: Secondary | ICD-10-CM

## 2021-05-23 DIAGNOSIS — Z79899 Other long term (current) drug therapy: Secondary | ICD-10-CM

## 2021-05-23 DIAGNOSIS — Z0000 Encounter for general adult medical examination without abnormal findings: Secondary | ICD-10-CM | POA: Diagnosis not present

## 2021-05-23 DIAGNOSIS — K219 Gastro-esophageal reflux disease without esophagitis: Secondary | ICD-10-CM | POA: Diagnosis not present

## 2021-05-23 DIAGNOSIS — F3342 Major depressive disorder, recurrent, in full remission: Secondary | ICD-10-CM

## 2021-05-23 DIAGNOSIS — G5701 Lesion of sciatic nerve, right lower limb: Secondary | ICD-10-CM

## 2021-05-23 DIAGNOSIS — R69 Illness, unspecified: Secondary | ICD-10-CM | POA: Diagnosis not present

## 2021-05-23 LAB — CMP14+EGFR
ALT: 30 IU/L (ref 0–32)
AST: 27 IU/L (ref 0–40)
Albumin/Globulin Ratio: 2 (ref 1.2–2.2)
Albumin: 5.1 g/dL — ABNORMAL HIGH (ref 3.8–4.8)
Alkaline Phosphatase: 97 IU/L (ref 44–121)
BUN/Creatinine Ratio: 34 — ABNORMAL HIGH (ref 12–28)
BUN: 30 mg/dL — ABNORMAL HIGH (ref 8–27)
Bilirubin Total: 0.4 mg/dL (ref 0.0–1.2)
CO2: 26 mmol/L (ref 20–29)
Calcium: 10.3 mg/dL (ref 8.7–10.3)
Chloride: 97 mmol/L (ref 96–106)
Creatinine, Ser: 0.89 mg/dL (ref 0.57–1.00)
Globulin, Total: 2.6 g/dL (ref 1.5–4.5)
Glucose: 94 mg/dL (ref 70–99)
Potassium: 4.1 mmol/L (ref 3.5–5.2)
Sodium: 139 mmol/L (ref 134–144)
Total Protein: 7.7 g/dL (ref 6.0–8.5)
eGFR: 72 mL/min/1.73

## 2021-05-23 LAB — CBC WITH DIFFERENTIAL/PLATELET
Basophils Absolute: 0 x10E3/uL (ref 0.0–0.2)
Basos: 1 %
EOS (ABSOLUTE): 0.3 x10E3/uL (ref 0.0–0.4)
Eos: 4 %
Hematocrit: 41.7 % (ref 34.0–46.6)
Hemoglobin: 14.3 g/dL (ref 11.1–15.9)
Immature Grans (Abs): 0 x10E3/uL (ref 0.0–0.1)
Immature Granulocytes: 0 %
Lymphocytes Absolute: 1.2 x10E3/uL (ref 0.7–3.1)
Lymphs: 15 %
MCH: 31.4 pg (ref 26.6–33.0)
MCHC: 34.3 g/dL (ref 31.5–35.7)
MCV: 91 fL (ref 79–97)
Monocytes Absolute: 0.5 x10E3/uL (ref 0.1–0.9)
Monocytes: 6 %
Neutrophils Absolute: 6 x10E3/uL (ref 1.4–7.0)
Neutrophils: 74 %
Platelets: 248 x10E3/uL (ref 150–450)
RBC: 4.56 x10E6/uL (ref 3.77–5.28)
RDW: 11.8 % (ref 11.7–15.4)
WBC: 8.1 x10E3/uL (ref 3.4–10.8)

## 2021-05-23 LAB — LIPID PANEL
Chol/HDL Ratio: 2.8 ratio (ref 0.0–4.4)
Cholesterol, Total: 149 mg/dL (ref 100–199)
HDL: 53 mg/dL (ref >39)
LDL Chol Calc (NIH): 75 mg/dL (ref 0–99)
Triglycerides: 115 mg/dL (ref 0–149)
VLDL Cholesterol Cal: 21 mg/dL (ref 5–40)

## 2021-05-23 LAB — TSH: TSH: 2.55 u[IU]/mL (ref 0.450–4.500)

## 2021-05-23 MED ORDER — HYDROCHLOROTHIAZIDE 25 MG PO TABS: 25.0000 mg | ORAL_TABLET | Freq: Every day | ORAL | 3 refills | Status: DC

## 2021-05-23 MED ORDER — LISINOPRIL 5 MG PO TABS: ORAL_TABLET | ORAL | 4 refills | Status: DC

## 2021-05-23 MED ORDER — DICLOFENAC SODIUM 75 MG PO TBEC: 75.0000 mg | DELAYED_RELEASE_TABLET | Freq: Two times a day (BID) | ORAL | 3 refills | Status: DC

## 2021-05-23 MED ORDER — BUPROPION HCL ER (SR) 150 MG PO TB12: 150.0000 mg | ORAL_TABLET | Freq: Every day | ORAL | 3 refills | Status: DC

## 2021-05-23 MED ORDER — ROSUVASTATIN CALCIUM 40 MG PO TABS: 40.0000 mg | ORAL_TABLET | Freq: Every day | ORAL | 3 refills | Status: DC

## 2021-05-23 MED ORDER — ALPRAZOLAM 0.5 MG PO TABS: 0.5000 mg | ORAL_TABLET | Freq: Every evening | ORAL | 1 refills | Status: DC | PRN

## 2021-05-23 MED ORDER — EZETIMIBE 10 MG PO TABS: 10.0000 mg | ORAL_TABLET | Freq: Every day | ORAL | 1 refills | Status: DC

## 2021-05-23 NOTE — Progress Notes (Signed)
Subjective:    Patient ID: Kristen Arroyo, female    DOB: Dec 14, 1955, 66 y.o.   MRN: [id number]  Chief Complaint  Patient presents with   Annual Exam    No pap.   Nausea    Several days, comes and goes  When she gets up moving around.   Pt presents to the office today for CPE without pap. She had her pap on 05/21/20. She is followed by Cardiologists  annually for angina, HTN and family hx of CAD.  Hypertension This is a chronic problem. The current episode started more than 1 year ago. The problem has been resolved since onset. The problem is controlled. Associated symptoms include anxiety. Pertinent negatives include no malaise/fatigue, peripheral edema or shortness of breath. Risk factors for coronary artery disease include dyslipidemia, obesity and sedentary lifestyle. The current treatment provides moderate improvement.  Gastroesophageal Reflux She complains of belching, heartburn and nausea. This is a chronic problem. The current episode started more than 1 year ago. The problem occurs occasionally. The problem has been waxing and waning. She has tried a PPI for the symptoms. The treatment provided moderate relief.  Depression        This is a chronic problem.  The current episode started more than 1 year ago.   The onset quality is gradual.   Associated symptoms include restlessness.  Associated symptoms include no helplessness, no hopelessness, not irritable and not sad.  Past medical history includes anxiety.   Hyperlipidemia This is a chronic problem. The current episode started more than 1 year ago. Exacerbating diseases include obesity. Pertinent negatives include no shortness of breath. Current antihyperlipidemic treatment includes statins. The current treatment provides moderate improvement of lipids. Risk factors for coronary artery disease include dyslipidemia, hypertension, a sedentary lifestyle and post-menopausal.  Anxiety Presents for follow-up visit. Symptoms include  depressed mood, excessive worry, irritability, nausea and restlessness. Patient reports no shortness of breath. Symptoms occur occasionally. The severity of symptoms is moderate.       Review of Systems  Constitutional:  Positive for irritability. Negative for malaise/fatigue.  Respiratory:  Negative for shortness of breath.   Gastrointestinal:  Positive for heartburn and nausea.  Psychiatric/Behavioral:  Positive for depression.   All other systems reviewed and are negative.  Family History  Problem Relation Age of Onset   Hypertension Mother    Peripheral vascular disease Mother    Heart failure Mother    Hypertension Father    Stroke Father        mini strokes   CAD Brother        CABG at age 39   Hypertension Brother    Diabetes Brother    CAD Brother        Stents in early 60's   Hypertension Brother    Multiple sclerosis Brother    Hypertension Brother    Asthma Brother    Hypertension Brother    Hypertension Brother    Colon cancer Neg Hx    Social History   Socioeconomic History   Marital status: Married    Spouse name: Not on file   Number of children: 2   Years of education: Not on file   Highest education level: Not on file  Occupational History   Occupation: Best boy: [id number]  Tobacco Use   Smoking status: Never   Smokeless tobacco: Never  Vaping Use   Vaping Use: Never used  Substance and Sexual Activity   Alcohol use: No  Drug use: No   Sexual activity: Not on file  Other Topics Concern   Not on file  Social History Narrative   Not on file   Social Determinants of Health   Financial Resource Strain: Not on file  Food Insecurity: Not on file  Transportation Needs: Not on file  Physical Activity: Not on file  Stress: Not on file  Social Connections: Not on file       Objective:   Physical Exam Vitals reviewed.  Constitutional:      General: She is not irritable.She is not in acute distress.    Appearance: She is  well-developed.  HENT:     Head: Normocephalic and atraumatic.     Right Ear: Tympanic membrane normal.     Left Ear: Tympanic membrane normal.  Eyes:     Pupils: Pupils are equal, round, and reactive to light.  Neck:     Thyroid: No thyromegaly.  Cardiovascular:     Rate and Rhythm: Normal rate and regular rhythm.     Heart sounds: Normal heart sounds. No murmur heard. Pulmonary:     Effort: Pulmonary effort is normal. No respiratory distress.     Breath sounds: Normal breath sounds. No wheezing.  Abdominal:     General: Bowel sounds are normal. There is no distension.     Palpations: Abdomen is soft.     Tenderness: There is no abdominal tenderness.  Musculoskeletal:        General: No tenderness. Normal range of motion.     Cervical back: Normal range of motion and neck supple.  Skin:    General: Skin is warm and dry.  Neurological:     Mental Status: She is alert and oriented to person, place, and time.     Cranial Nerves: No cranial nerve deficit.     Deep Tendon Reflexes: Reflexes are normal and symmetric.  Psychiatric:        Behavior: Behavior normal.        Thought Content: Thought content normal.        Judgment: Judgment normal.      BP 130/81    Pulse 80    Temp (!) 97.4 F (36.3 C) (Temporal)    Ht '5\' 3"'  (1.6 m)    Wt 166 lb 9.6 oz (75.6 kg)    BMI 29.51 kg/m      Assessment & Plan:  Kristen Arroyo comes in today with chief complaint of Annual Exam (No pap.) and Nausea (Several days, comes and goes/ When she gets up moving around.)   Diagnosis and orders addressed:  1. Controlled substance agreement signed - Tox[employer] Select 13 (MW), Urine - ALPRAZolam (XANAX) 0.5 MG tablet; Take 1 tablet (0.5 mg total) by mouth at bedtime as needed. for sleep  Dispense: 20 tablet; Refill: 1 - CMP14+EGFR - CBC with Differential/Platelet  2. GAD (generalized anxiety disorder) - ToxASSURE Select 13 (MW), Urine - ALPRAZolam (XANAX) 0.5 MG tablet; Take 1 tablet (0.5  mg total) by mouth at bedtime as needed. for sleep  Dispense: 20 tablet; Refill: 1 - CMP14+EGFR - CBC with Differential/Platelet  3. Essential hypertension - lisinopril (ZESTRIL) 5 MG tablet; TAKE 1 TABLET BY MOUTH ONCE DAILY . APPOINTMENT REQUIRED FOR FUTURE REFILLS  Dispense: 90 tablet; Refill: 4 - hydrochlorothiazide (HYDRODIURIL) 25 MG tablet; Take 1 tablet (25 mg total) by mouth daily.  Dispense: 90 tablet; Refill: 3 - CMP14+EGFR - CBC with Differential/Platelet  4. Piriformis syndrome of right side - diclofenac (  VOLTAREN) 75 MG EC tablet; Take 1 tablet (75 mg total) by mouth 2 (two) times daily.  Dispense: 180 tablet; Refill: 3 - CMP14+EGFR - CBC with Differential/Platelet  5. Recurrent major depressive disorder, in full remission ([place]) - buPROPion (WELLBUTRIN SR) 150 MG 12 hr tablet; Take 1 tablet (150 mg total) by mouth daily.  Dispense: 90 tablet; Refill: 3 - CMP14+EGFR - CBC with Differential/Platelet  6. Gastroesophageal reflux disease without esophagitis - CMP14+EGFR - CBC with Differential/Platelet  7. Mixed hyperlipidemia - ezetimibe (ZETIA) 10 MG tablet; Take 1 tablet (10 mg total) by mouth daily.  Dispense: 90 tablet; Refill: 1 - rosuvastatin (CRESTOR) 40 MG tablet; Take 1 tablet (40 mg total) by mouth daily.  Dispense: 90 tablet; Refill: 3 - CMP14+EGFR - CBC with Differential/Platelet  8. Annual physical exam  - CMP14+EGFR - CBC with Differential/Platelet - Lipid panel - TSH   Labs pending Patient reviewed in [place] controlled database, no flags noted. Contract and drug screen are up dated today.  Health Maintenance reviewed Diet and exercise encouraged  Follow up plan: 6 months    [name], FNP

## 2021-05-23 NOTE — Patient Instructions (Signed)

## 2021-05-26 LAB — TOXASSURE SELECT 13 (MW), URINE

## 2021-07-09 ENCOUNTER — Other Ambulatory Visit: Payer: Self-pay | Admitting: Family

## 2021-07-09 DIAGNOSIS — F3342 Major depressive disorder, recurrent, in full remission: Secondary | ICD-10-CM

## 2021-07-12 ENCOUNTER — Other Ambulatory Visit: Payer: Self-pay | Admitting: Family

## 2021-07-12 DIAGNOSIS — F3342 Major depressive disorder, recurrent, in full remission: Secondary | ICD-10-CM

## 2021-08-28 DIAGNOSIS — H524 Presbyopia: Secondary | ICD-10-CM | POA: Diagnosis not present

## 2021-08-28 DIAGNOSIS — H5213 Myopia, bilateral: Secondary | ICD-10-CM | POA: Diagnosis not present

## 2021-08-29 ENCOUNTER — Telehealth: Payer: Self-pay | Admitting: Family

## 2021-08-29 MED ORDER — OMEPRAZOLE 20 MG PO CPDR: 20.0000 mg | DELAYED_RELEASE_CAPSULE | Freq: Every day | ORAL | 1 refills | Status: DC

## 2021-08-29 NOTE — Telephone Encounter (Signed)
Prescription sent to pharmacy.   Jannifer Rodney, FNP

## 2021-10-04 ENCOUNTER — Other Ambulatory Visit: Payer: Medicare HMO

## 2021-10-04 DIAGNOSIS — Z79899 Other long term (current) drug therapy: Secondary | ICD-10-CM | POA: Diagnosis not present

## 2021-10-04 DIAGNOSIS — E782 Mixed hyperlipidemia: Secondary | ICD-10-CM | POA: Diagnosis not present

## 2021-10-05 LAB — HEPATIC FUNCTION PANEL
ALT: 30 IU/L (ref 0–32)
AST: 27 IU/L (ref 0–40)
Albumin: 4.4 g/dL (ref 3.8–4.8)
Alkaline Phosphatase: 89 IU/L (ref 44–121)
Bilirubin Total: 0.4 mg/dL (ref 0.0–1.2)
Bilirubin, Direct: 0.12 mg/dL (ref 0.00–0.40)
Total Protein: 6.7 g/dL (ref 6.0–8.5)

## 2021-10-05 LAB — LIPID PANEL
Chol/HDL Ratio: 2.9 ratio (ref 0.0–4.4)
Cholesterol, Total: 135 mg/dL (ref 100–199)
HDL: 47 mg/dL (ref >39)
LDL Chol Calc (NIH): 71 mg/dL (ref 0–99)
Triglycerides: 90 mg/dL (ref 0–149)
VLDL Cholesterol Cal: 17 mg/dL (ref 5–40)

## 2021-10-07 ENCOUNTER — Telehealth: Payer: Self-pay

## 2021-10-07 DIAGNOSIS — E782 Mixed hyperlipidemia: Secondary | ICD-10-CM

## 2021-10-07 NOTE — Telephone Encounter (Signed)
The patient has been notified of the result and verbalized understanding.  All questions (if any) were answered. Theresia Majors, RN 10/07/2021 11:31 AM  Educated patient on low cholesterol diet. Labs have been scheduled.

## 2021-10-07 NOTE — Telephone Encounter (Signed)
-----   Message from Beatrice Lecher, [place] sent at 10/07/2021  9:33 AM EDT ----- LFTs normal. LDL cholesterol still above goal despite being on Rosuvastatin 40 mg once daily and Ezetimibe 10 mg once daily. Recommend working on diet and recheck in 6 mos. If still > 70 at that point, would have her see Lipid clinic to consider PCSK9 inhibitors. PLAN:  -Work on diet to lower cholesterol further -Continue current meds -Fasting Lipids in 6 mos.  Tereso Newcomer, PA-C    10/07/2021 9:26 AM

## 2021-10-30 ENCOUNTER — Ambulatory Visit (INDEPENDENT_AMBULATORY_CARE_PROVIDER_SITE_OTHER): Payer: Medicare HMO | Admitting: Cardiovascular Disease

## 2021-10-30 ENCOUNTER — Encounter: Payer: Self-pay | Admitting: Cardiovascular Disease

## 2021-10-30 VITALS — BP 110/80 | HR 62 | Ht 63.0 in | Wt 165.2 lb

## 2021-10-30 DIAGNOSIS — I2510 Atherosclerotic heart disease of native coronary artery without angina pectoris: Secondary | ICD-10-CM | POA: Diagnosis not present

## 2021-10-30 DIAGNOSIS — E782 Mixed hyperlipidemia: Secondary | ICD-10-CM | POA: Diagnosis not present

## 2021-10-30 DIAGNOSIS — I10 Essential (primary) hypertension: Secondary | ICD-10-CM | POA: Diagnosis not present

## 2021-10-30 NOTE — Patient Instructions (Addendum)
Medication Instructions:  Your physician recommends that you continue on your current medications as directed. Please refer to the Current Medication list given to you today.  *If you need a refill on your cardiac medications before your next appointment, please call your pharmacy*   Lab Work: NONE If you have labs (blood work) drawn today and your tests are completely normal, you will receive your results only by: MyChart Message (if you have MyChart) OR A paper copy in the mail If you have any lab test that is abnormal or we need to change your treatment, we will call you to review the results.  Testing/Procedures: Steffanie Dunn Stress test Your physician has requested that you have a lexiscan myoview. For further information please visit [email or web address] Please follow instruction sheet, as given.  Follow-Up: At [facility], you and your health needs are our priority.  As part of our continuing mission to provide you with exceptional heart care, we have created designated Provider Care Teams.  These Care Teams include your primary Cardiologist (physician) and Advanced Practice Providers (APPs -  Physician Assistants and Nurse Practitioners) who all work together to provide you with the care you need, when you need it.  Your next appointment:   1 year(s)  The format for your next appointment:   In Person  Provider:   Tonny Bollman, MD     Other Instructions See separate instructions for stress test Mediterranean diet information printed for your review Important Information About Sugar

## 2021-10-30 NOTE — Progress Notes (Signed)
Cardiology Office Note:    Date:  11/01/2021   ID:  Kristen Arroyo, DOB [birth year], MRN [id number]  PCP:  Junie Spencer, FNP   [facility] Providers Cardiologist:  Tonny Bollman, MD     Referring MD: Junie Spencer, FNP   Chief Complaint  Patient presents with   Hypertension    History of Present Illness:    Kristen Arroyo is a 66 y.o. female with a hx of: Coronary artery disease  Non-obs by CT in 2020 Hypertension  Hyperlipidemia  FHx of CAD   The patient is here with her husband today.  She has been doing fairly well with mild shortness of breath with exertion.  She has also had brief chest pains but nothing exertional.  She denies orthopnea, PND, lightheadedness, or syncope.  Past Medical History:  Diagnosis Date   Anxiety    Arthritis    knees   GERD (gastroesophageal reflux disease)    History of seasonal allergies    Hyperlipidemia    Hypertension     Past Surgical History:  Procedure Laterality Date   CARPAL TUNNEL RELEASE  2006   right   KNEE ARTHROSCOPY     left: torn meniscus    Current Medications: Current Meds  Medication Sig   albuterol (VENTOLIN HFA) 108 (90 Base) MCG/ACT inhaler Inhale 2 puffs into the lungs every 6 (six) hours as needed for wheezing or shortness of breath.   ALPRAZolam (XANAX) 0.5 MG tablet Take 1 tablet (0.5 mg total) by mouth at bedtime as needed. for sleep   ascorbic acid (VITAMIN C) 500 MG tablet Take 500 mg by mouth daily.   aspirin EC 81 MG tablet Take 1 tablet (81 mg total) by mouth daily.   buPROPion (WELLBUTRIN SR) 150 MG 12 hr tablet Take 1 tablet (150 mg total) by mouth daily.   calcium-vitamin D (OSCAL WITH D) 500-200 MG-UNIT tablet Take 1 tablet by mouth daily.   Cholecalciferol (VITAMIN D-3) 125 MCG (5000 UT) TABS Take 5,000 Units by mouth daily.   diclofenac (VOLTAREN) 75 MG EC tablet Take 1 tablet (75 mg total) by mouth 2 (two) times daily.   dimenhyDRINATE (DRAMAMINE) 50 MG tablet Take 50  mg by mouth every 8 (eight) hours as needed.   ezetimibe (ZETIA) 10 MG tablet Take 1 tablet (10 mg total) by mouth daily.   hydrochlorothiazide (HYDRODIURIL) 25 MG tablet Take 1 tablet (25 mg total) by mouth daily.   lactase (LACTAID) 3000 units tablet Take 3,000 Units by mouth as needed (STOMACH UPSET).   lisinopril (ZESTRIL) 5 MG tablet TAKE 1 TABLET BY MOUTH ONCE DAILY . APPOINTMENT REQUIRED FOR FUTURE REFILLS   loperamide (IMODIUM A-D) 2 MG tablet Take 2 mg by mouth 4 (four) times daily as needed for diarrhea or loose stools.   loratadine (CLARITIN) 10 MG tablet Take 1 tablet (10 mg total) by mouth daily.   Multiple Vitamin (MULTIVITAMIN) tablet Take 1 tablet by mouth daily.   omeprazole (PRILOSEC) 20 MG capsule Take 1 capsule (20 mg total) by mouth daily.   rosuvastatin (CRESTOR) 40 MG tablet Take 1 tablet (40 mg total) by mouth daily.   zinc gluconate 50 MG tablet Take 50 mg by mouth daily.     Allergies:   Patient has no known allergies.   Social History   Socioeconomic History   Marital status: Married    Spouse name: Not on file   Number of children: 2   Years of education:  Not on file   Highest education level: Not on file  Occupational History   Occupation: Event organiser: [employer]  Tobacco Use   Smoking status: Never   Smokeless tobacco: Never  Vaping Use   Vaping Use: Never used  Substance and Sexual Activity   Alcohol use: No   Drug use: No   Sexual activity: Not on file  Other Topics Concern   Not on file  Social History Narrative   Not on file   Social Determinants of Health   Financial Resource Strain: Not on file  Food Insecurity: Not on file  Transportation Needs: Not on file  Physical Activity: Not on file  Stress: Not on file  Social Connections: Not on file     Family History: The patient's family history includes Asthma in her brother; CAD in her brother and brother; Diabetes in her brother; Heart failure in her mother; Hypertension in her  brother, brother, brother, brother, brother, father, and mother; Multiple sclerosis in her brother; Peripheral vascular disease in her mother; Stroke in her father. There is no history of Colon cancer.  ROS:   Please see the history of present illness.    All other systems reviewed and are negative.  EKGs/Labs/Other Studies Reviewed:    The following studies were reviewed today: Coronary CTA 10/04/2018: IMPRESSION: 1. Coronary calcium score of 634. This was 44 percentile for age and sex matched control.   2. Normal coronary origin with right dominance.   3. Moderate mixed plaque in the proximal LAD with a focal stenosis 50-69%. CAD RADS 3. Additional analysis with CT FFR will be submitted. Aggressive risk factor modification is recommended.   CT-FFR: 1. Left Main:  No significant stenosis.   2. LAD: No significant stenosis. 3. LCX: No significant stenosis. 4. RCA: No significant stenosis.   IMPRESSION: 1.  CT FFR analysis didn't show any significant stenosis.  EKG:  EKG is ordered today.  The ekg ordered today demonstrates NSR 62 bpm, low-voltage QRS, age-indeterminate septal infarct, nonspecific ST-T changes  Recent Labs: 05/23/2021: BUN 30; Creatinine, Ser 0.89; Hemoglobin 14.3; Platelets 248; Potassium 4.1; Sodium 139; TSH 2.550 10/04/2021: ALT 30  Recent Lipid Panel    Component Value Date/Time   CHOL 135 10/04/2021 0953   TRIG 90 10/04/2021 0953   HDL 47 10/04/2021 0953   CHOLHDL 2.9 10/04/2021 0953   LDLCALC 71 10/04/2021 0953     Risk Assessment/Calculations:           Physical Exam:    VS:  BP 110/80   Pulse 62   Ht 5\' 3"  (1.6 m)   Wt 165 lb 3.2 oz (74.9 kg)   SpO2 99%   BMI 29.26 kg/m     Wt Readings from Last 3 Encounters:  10/30/21 165 lb 3.2 oz (74.9 kg)  05/23/21 166 lb 9.6 oz (75.6 kg)  10/17/20 168 lb 6.4 oz (76.4 kg)     GEN:  Well nourished, well developed in no acute distress HEENT: Normal NECK: No JVD; No carotid bruits LYMPHATICS:  No lymphadenopathy CARDIAC: RRR, no murmurs, rubs, gallops RESPIRATORY:  Clear to auscultation without rales, wheezing or rhonchi  ABDOMEN: Soft, non-tender, non-distended MUSCULOSKELETAL:  No edema; No deformity  SKIN: Warm and dry NEUROLOGIC:  Alert and oriented x 3 PSYCHIATRIC:  Normal affect   ASSESSMENT:    1. Coronary artery disease involving native coronary artery of native heart without angina pectoris   2. Mixed hyperlipidemia   3. Essential hypertension  PLAN:    In order of problems listed above:  The patient has known, moderate LAD stenosis based on coronary CTA in 2020.  She had no other significant CAD.  As outlined above, she has exertional dyspnea and some atypical chest pain.  I think it is reasonable to proceed with a nuclear stress test to evaluate for progressive LAD stenosis and/or myocardial ischemia.  We will continue her current medical program. Treated with rosuvastatin 40 mg and Zetia 10 mg daily.  Last LDL cholesterol is 71 mg/dL on treatment.  Continue current medical therapy.  Continue to work on lifestyle modification which we discussed today. Blood pressure is well controlled on current medical regimen (lisinopril and HCTZ).      Shared Decision Making/Informed Consent The risks [chest pain, shortness of breath, cardiac arrhythmias, dizziness, blood pressure fluctuations, myocardial infarction, stroke/transient ischemic attack, nausea, vomiting, allergic reaction, radiation exposure, metallic taste sensation and life-threatening complications (estimated to be 1 in 10,000)], benefits (risk stratification, diagnosing coronary artery disease, treatment guidance) and alternatives of a nuclear stress test were discussed in detail with Kristen Arroyo and she agrees to proceed.    Medication Adjustments/Labs and Tests Ordered: Current medicines are reviewed at length with the patient today.  Concerns regarding medicines are outlined above.  Orders Placed This  Encounter  Procedures   Cardiac Stress Test: Informed Consent Details: Physician/Practitioner Attestation; Transcribe to consent form and obtain patient signature   MYOCARDIAL PERFUSION IMAGING   EKG 12-Lead   No orders of the defined types were placed in this encounter.   Patient Instructions  Medication Instructions:  Your physician recommends that you continue on your current medications as directed. Please refer to the Current Medication list given to you today.  *If you need a refill on your cardiac medications before your next appointment, please call your pharmacy*   Lab Work: NONE If you have labs (blood work) drawn today and your tests are completely normal, you will receive your results only by: MyChart Message (if you have MyChart) OR A paper copy in the mail If you have any lab test that is abnormal or we need to change your treatment, we will call you to review the results.  Testing/Procedures: Steffanie Dunn Stress test Your physician has requested that you have a lexiscan myoview. For further information please visit [email or web address] Please follow instruction sheet, as given.  Follow-Up: At [facility], you and your health needs are our priority.  As part of our continuing mission to provide you with exceptional heart care, we have created designated Provider Care Teams.  These Care Teams include your primary Cardiologist (physician) and Advanced Practice Providers (APPs -  Physician Assistants and Nurse Practitioners) who all work together to provide you with the care you need, when you need it.  Your next appointment:   1 year(s)  The format for your next appointment:   In Person  Provider:   Tonny Bollman, MD     Other Instructions See separate instructions for stress test Mediterranean diet information printed for your review Important Information About Sugar         Signed, Tonny Bollman, MD  11/01/2021 5:13 PM    [facility]

## 2021-11-01 ENCOUNTER — Telehealth (HOSPITAL_COMMUNITY): Payer: Self-pay | Admitting: Radiology

## 2021-11-01 NOTE — Telephone Encounter (Signed)
Left message on voicemail per DPR in reference to upcoming appointment scheduled on 8/10 at 8:15 with detailed instructions given per Myocardial Perfusion Study Information Sheet for the test. LM to arrive 15 minutes early, and that it is imperative to arrive on time for appointment to keep from having the test rescheduled. If you need to cancel or reschedule your appointment, please call the office within 24 hours of your appointment. Failure to do so may result in a cancellation of your appointment, and a $50 no show fee. Phone number given for call back for any questions.   

## 2021-11-05 ENCOUNTER — Telehealth: Payer: Self-pay | Admitting: Cardiovascular Disease

## 2021-11-05 ENCOUNTER — Telehealth (HOSPITAL_COMMUNITY): Payer: Self-pay

## 2021-11-05 NOTE — Telephone Encounter (Signed)
Detailed instructions left on the patient's answering machine. Asked to call back with any questions. S.Mac Dowdell EMTP 

## 2021-11-05 NOTE — Telephone Encounter (Signed)
Pt c/o medication issue:  1. Name of Medication: ALPRAZolam (XANAX) 0.5 MG tablet  2. How are you currently taking this medication (dosage and times per day)? Take 1 tablet (0.5 mg total) by mouth at bedtime as needed. for sleep  3. Are you having a reaction (difficulty breathing--STAT)? No  4. What is your medication issue? Pt would like to know if she is able to take medication before scheduled test tomorrow. Pt states that she gets nervous. Please advise

## 2021-11-05 NOTE — Telephone Encounter (Signed)
Patient states she gets nervous easily and would like to know if she can take Xanax, Imodium and Dramamine prior to [place] on 8/10.   Advised patient she may not want to take Dramamine as this can cause her to be too sleepy, but that she may take Xanax and Imodium prior to [place].  Patient verbalized understanding and expressed appreciation for call.

## 2021-11-07 ENCOUNTER — Ambulatory Visit (HOSPITAL_COMMUNITY): Payer: Medicare HMO | Attending: Cardiovascular Disease

## 2021-11-07 DIAGNOSIS — I2510 Atherosclerotic heart disease of native coronary artery without angina pectoris: Secondary | ICD-10-CM | POA: Diagnosis not present

## 2021-11-07 DIAGNOSIS — I10 Essential (primary) hypertension: Secondary | ICD-10-CM | POA: Diagnosis not present

## 2021-11-07 DIAGNOSIS — E782 Mixed hyperlipidemia: Secondary | ICD-10-CM | POA: Insufficient documentation

## 2021-11-07 LAB — MYOCARDIAL PERFUSION IMAGING
LV dias vol: 55 mL (ref 46–106)
LV sys vol: 16 mL
Nuc Stress EF: 71 %
Peak HR: 85 {beats}/min
Rest HR: 68 {beats}/min
Rest Nuclear Isotope Dose: 10.8 mCi
SDS: 0
SRS: 0
SSS: 0
ST Depression (mm): 0 mm
Stress Nuclear Isotope Dose: 32.1 mCi
TID: 1.03

## 2021-11-07 MED ORDER — REGADENOSON 0.4 MG/5ML IV SOLN
0.4000 mg | Freq: Once | INTRAVENOUS | Status: AC
Start: 2021-11-07 — End: 2021-11-07
  Administered 2021-11-07: 0.4 mg via INTRAVENOUS

## 2021-11-07 MED ORDER — TECHNETIUM TC 99M TETROFOSMIN IV KIT
10.8000 | PACK | Freq: Once | INTRAVENOUS | Status: AC | PRN
  Administered 2021-11-07: 10.8 via INTRAVENOUS

## 2021-11-07 MED ORDER — TECHNETIUM TC 99M TETROFOSMIN IV KIT
32.1000 | PACK | Freq: Once | INTRAVENOUS | Status: AC | PRN
  Administered 2021-11-07: 32.1 via INTRAVENOUS

## 2022-02-04 ENCOUNTER — Ambulatory Visit (INDEPENDENT_AMBULATORY_CARE_PROVIDER_SITE_OTHER): Payer: Medicare HMO | Admitting: Nurse Practitioner

## 2022-02-04 ENCOUNTER — Encounter: Payer: Self-pay | Admitting: Nurse Practitioner

## 2022-02-04 VITALS — BP 119/69 | HR 79 | Ht 63.0 in | Wt 168.0 lb

## 2022-02-04 DIAGNOSIS — J069 Acute upper respiratory infection, unspecified: Secondary | ICD-10-CM

## 2022-02-04 MED ORDER — AMOXICILLIN-POT CLAVULANATE 875-125 MG PO TABS: 1.0000 | ORAL_TABLET | Freq: Two times a day (BID) | ORAL | 0 refills | Status: DC

## 2022-02-04 MED ORDER — HYDROCODONE BIT-HOMATROP MBR 5-1.5 MG/5ML PO SOLN: 5.0000 mL | Freq: Four times a day (QID) | ORAL | 0 refills | Status: DC | PRN

## 2022-02-04 NOTE — Patient Instructions (Signed)
1. Take meds as prescribed 2. Use a cool mist humidifier especially during the winter months and when heat has been humid. 3. Use saline nose sprays frequently 4. Saline irrigations of the nose can be very helpful if done frequently.  * 4X daily for 1 week*  * Use of a nettie pot can be helpful with this. Follow directions with this* 5. Drink plenty of fluids 6. Keep thermostat turn down low 7.For any cough or congestion- hycodan withn sedation precautions 8. For fever or aces or pains- take tylenol or ibuprofen appropriate for age and weight.  * for fevers greater than 101 orally you may alternate ibuprofen and tylenol every  3 hours.

## 2022-02-04 NOTE — Progress Notes (Signed)
Subjective:    Patient ID: Kristen Arroyo, female    DOB: Oct 16, 1955, 66 y.o.   MRN: [id number]  URI  The current episode started in the past 7 days. The problem has been waxing and waning. There has been no fever. Associated symptoms include congestion, coughing, headaches, rhinorrhea and sinus pain. She has tried acetaminophen and antihistamine for the symptoms. The treatment provided mild relief.       Review of Systems  HENT:  Positive for congestion, rhinorrhea and sinus pain.   Respiratory:  Positive for cough.   Neurological:  Positive for headaches.       Objective:   Physical Exam Vitals reviewed.  Constitutional:      Appearance: Normal appearance.  HENT:     Right Ear: Tympanic membrane normal.     Left Ear: Tympanic membrane normal.     Nose: Congestion and rhinorrhea present.     Mouth/Throat:     Pharynx: No oropharyngeal exudate or posterior oropharyngeal erythema.  Cardiovascular:     Rate and Rhythm: Normal rate and regular rhythm.     Heart sounds: Normal heart sounds.  Pulmonary:     Effort: Pulmonary effort is normal.     Breath sounds: Normal breath sounds.  Skin:    General: Skin is warm.  Neurological:     General: No focal deficit present.     Mental Status: She is alert and oriented to person, place, and time.  Psychiatric:        Mood and Affect: Mood normal.        Behavior: Behavior normal.    BP 119/69   Pulse 79   Ht 5\' 3"  (1.6 m)   Wt 168 lb (76.2 kg)   SpO2 97%   BMI 29.76 kg/m         Assessment & Plan:   Kristen Arroyo in today with chief complaint of URI   1. URI with cough and congestion 1. Take meds as prescribed 2. Use a cool mist humidifier especially during the winter months and when heat has been humid. 3. Use saline nose sprays frequently 4. Saline irrigations of the nose can be very helpful if done frequently.  * 4X daily for 1 week*  * Use of a nettie pot can be helpful with this. Follow directions  with this* 5. Drink plenty of fluids 6. Keep thermostat turn down low 7.For any cough or congestion- hycodan with sedation orecautions 8. For fever or aces or pains- take tylenol or ibuprofen appropriate for age and weight.  * for fevers greater than 101 orally you may alternate ibuprofen and tylenol every  3 hours.   Meds ordered this encounter  Medications   amoxicillin-clavulanate (AUGMENTIN) 875-125 MG tablet    Sig: Take 1 tablet by mouth 2 (two) times daily.    Dispense:  14 tablet    Refill:  0    Order Specific Question:   Supervising Provider    Answer:   Caryl Pina A [[id number]]   HYDROcodone bit-homatropine (HYCODAN) 5-1.5 MG/5ML syrup    Sig: Take 5 mLs by mouth every 6 (six) hours as needed for cough.    Dispense:  120 mL    Refill:  0    Order Specific Question:   Supervising Provider    Answer:   Caryl Pina A [[id number]]       The above assessment and management plan was discussed with the patient. The patient verbalized understanding of and has  agreed to the management plan. Patient is aware to call the clinic if symptoms persist or worsen. Patient is aware when to return to the clinic for a follow-up visit. Patient educated on when it is appropriate to go to the emergency department.   Mary-Margaret Hassell Done, FNP

## 2022-02-05 LAB — NOVEL CORONAVIRUS, NAA: SARS-CoV-2, NAA: DETECTED — AB

## 2022-02-06 ENCOUNTER — Telehealth: Payer: Self-pay | Admitting: Family

## 2022-02-06 NOTE — Telephone Encounter (Signed)
Patient aware and verbalized understanding. °

## 2022-02-06 NOTE — Telephone Encounter (Signed)
Patient calling to see if she can take Mucinex Dm. Please call back and advise.

## 2022-02-06 NOTE — Telephone Encounter (Signed)
Yes can take Mucinex. Should be plain mucinex.

## 2022-02-26 ENCOUNTER — Other Ambulatory Visit: Payer: Self-pay | Admitting: Family

## 2022-03-25 ENCOUNTER — Encounter: Payer: Self-pay | Admitting: Family

## 2022-03-25 ENCOUNTER — Other Ambulatory Visit: Payer: Self-pay | Admitting: Family

## 2022-03-25 NOTE — Telephone Encounter (Signed)
Called pt to schedule appt for med refill. No answer and will mail letter

## 2022-03-25 NOTE — Telephone Encounter (Signed)
30 day supply was sent in 02/26/22. Refill denied-please schedule appt with PCP for further refills.

## 2022-03-31 ENCOUNTER — Other Ambulatory Visit: Payer: Self-pay | Admitting: Cardiovascular Disease

## 2022-03-31 DIAGNOSIS — E782 Mixed hyperlipidemia: Secondary | ICD-10-CM

## 2022-04-02 ENCOUNTER — Other Ambulatory Visit: Payer: Self-pay | Admitting: Family

## 2022-04-09 ENCOUNTER — Other Ambulatory Visit: Payer: Self-pay

## 2022-05-01 ENCOUNTER — Other Ambulatory Visit: Payer: Self-pay | Admitting: Family

## 2022-05-26 ENCOUNTER — Other Ambulatory Visit (HOSPITAL_COMMUNITY)
Admission: RE | Admit: 2022-05-26 | Discharge: 2022-05-26 | Disposition: A | Discharge status: Home / Self Care | Payer: Medicare HMO | Source: Ambulatory Visit | Attending: Family | Admitting: Family

## 2022-05-26 ENCOUNTER — Ambulatory Visit (INDEPENDENT_AMBULATORY_CARE_PROVIDER_SITE_OTHER): Payer: Medicare HMO | Admitting: Family

## 2022-05-26 ENCOUNTER — Encounter: Payer: Self-pay | Admitting: Family

## 2022-05-26 VITALS — BP 125/83 | HR 84 | Temp 97.8°F | Resp 20 | Ht 63.0 in | Wt 171.0 lb

## 2022-05-26 DIAGNOSIS — Z780 Asymptomatic menopausal state: Secondary | ICD-10-CM

## 2022-05-26 DIAGNOSIS — M159 Polyosteoarthritis, unspecified: Secondary | ICD-10-CM | POA: Diagnosis not present

## 2022-05-26 DIAGNOSIS — Z79899 Other long term (current) drug therapy: Secondary | ICD-10-CM | POA: Diagnosis not present

## 2022-05-26 DIAGNOSIS — F411 Generalized anxiety disorder: Secondary | ICD-10-CM

## 2022-05-26 DIAGNOSIS — Z01419 Encounter for gynecological examination (general) (routine) without abnormal findings: Secondary | ICD-10-CM

## 2022-05-26 DIAGNOSIS — M25542 Pain in joints of left hand: Secondary | ICD-10-CM

## 2022-05-26 DIAGNOSIS — M25541 Pain in joints of right hand: Secondary | ICD-10-CM | POA: Diagnosis not present

## 2022-05-26 DIAGNOSIS — E782 Mixed hyperlipidemia: Secondary | ICD-10-CM

## 2022-05-26 DIAGNOSIS — R6889 Other general symptoms and signs: Secondary | ICD-10-CM | POA: Diagnosis not present

## 2022-05-26 DIAGNOSIS — Z0001 Encounter for general adult medical examination with abnormal findings: Secondary | ICD-10-CM

## 2022-05-26 DIAGNOSIS — K219 Gastro-esophageal reflux disease without esophagitis: Secondary | ICD-10-CM | POA: Diagnosis not present

## 2022-05-26 DIAGNOSIS — I10 Essential (primary) hypertension: Secondary | ICD-10-CM | POA: Diagnosis not present

## 2022-05-26 DIAGNOSIS — Z23 Encounter for immunization: Secondary | ICD-10-CM | POA: Diagnosis not present

## 2022-05-26 DIAGNOSIS — Z0000 Encounter for general adult medical examination without abnormal findings: Secondary | ICD-10-CM | POA: Diagnosis not present

## 2022-05-26 MED ORDER — ALPRAZOLAM 0.5 MG PO TABS: 0.5000 mg | ORAL_TABLET | Freq: Every evening | ORAL | 1 refills | Status: DC | PRN

## 2022-05-26 NOTE — Progress Notes (Signed)
Subjective:    Patient ID: Kristen Arroyo, female    DOB: [birth year], 67 y.o.   MRN: [id number]  Chief Complaint  Patient presents with   Annual Exam   Pt presents to the office today for CPE with pap. She is followed by Cardiologists  annually for angina, HTN and family hx of CAD.   Hypertension This is a chronic problem. The current episode started more than 1 year ago. The problem has been resolved since onset. The problem is controlled. Associated symptoms include anxiety. Pertinent negatives include no malaise/fatigue, peripheral edema or shortness of breath. Risk factors for coronary artery disease include dyslipidemia, obesity and sedentary lifestyle. The current treatment provides moderate improvement.  Gastroesophageal Reflux She complains of belching and heartburn. This is a chronic problem. The current episode started more than 1 year ago. The problem occurs occasionally. Associated symptoms include fatigue. Risk factors include obesity. She has tried a PPI for the symptoms. The treatment provided moderate relief.  Depression        This is a chronic problem.  The current episode started more than 1 year ago.   Associated symptoms include fatigue, restlessness and sad.  Associated symptoms include no helplessness and no hopelessness.  Past medical history includes anxiety.   Hyperlipidemia This is a chronic problem. The current episode started more than 1 year ago. The problem is controlled. Exacerbating diseases include obesity. Pertinent negatives include no shortness of breath. Current antihyperlipidemic treatment includes statins. The current treatment provides moderate improvement of lipids. Risk factors for coronary artery disease include dyslipidemia, hypertension and post-menopausal.  Anxiety Presents for follow-up visit. Symptoms include excessive worry, irritability, nervous/anxious behavior and restlessness. Patient reports no shortness of breath. Symptoms occur  occasionally. The severity of symptoms is mild.    Arthritis Presents for follow-up visit. She complains of pain and stiffness. Affected locations include the left MCP, right MCP, right knee, left knee, right foot and left foot. Her pain is at a severity of 7/10. Associated symptoms include fatigue.      Review of Systems  Constitutional:  Positive for fatigue and irritability. Negative for malaise/fatigue.  Respiratory:  Negative for shortness of breath.   Gastrointestinal:  Positive for heartburn.  Musculoskeletal:  Positive for arthritis and stiffness.  Psychiatric/Behavioral:  Positive for depression. The patient is nervous/anxious.   All other systems reviewed and are negative.      Family History  Problem Relation Age of Onset   Hypertension Mother    Peripheral vascular disease Mother    Heart failure Mother    Hypertension Father    Stroke Father        mini strokes   CAD Brother        CABG at age 24   Hypertension Brother    Diabetes Brother    CAD Brother        Stents in early 60's   Hypertension Brother    Multiple sclerosis Brother    Hypertension Brother    Asthma Brother    Hypertension Brother    Hypertension Brother    Colon cancer Neg Hx    Social History   Socioeconomic History   Marital status: Married    Spouse name: Not on file   Number of children: 2   Years of education: Not on file   Highest education level: Not on file  Occupational History   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  Constitutional:      General: She is not in acute distress.    Appearance: She is well-developed. She is obese.  HENT:     Head: Normocephalic and atraumatic.     Right Ear: Tympanic membrane normal.     Left Ear: Tympanic membrane normal.  Eyes:     Pupils: Pupils are equal, round, and reactive to light.  Neck:     Thyroid: No thyromegaly.  Cardiovascular:     Rate and Rhythm: Normal rate and regular rhythm.     Heart sounds: Normal heart sounds. No murmur heard. Pulmonary:     Effort: Pulmonary effort is normal. No respiratory distress.     Breath sounds: Normal breath sounds. No wheezing.  Abdominal:     General: Bowel sounds are normal. There is no distension.     Palpations: Abdomen is soft.     Tenderness: There is no abdominal tenderness.  Musculoskeletal:        General: No tenderness. Normal range of motion.     Cervical back: Normal range of motion and neck supple.  Skin:    General: Skin is warm and dry.  Neurological:     Mental Status: She is alert and oriented to person, place, and time.     Cranial Nerves: No cranial nerve deficit.     Deep Tendon Reflexes: Reflexes are normal and symmetric.  Psychiatric:        Behavior: Behavior normal.        Thought Content: Thought content normal.        Judgment: Judgment normal.       BP 125/83   Pulse 84   Temp 97.8 F (36.6 C) (Oral)   Resp 20   Ht '5\' 3"'$  (1.6 m)   Wt 171 lb (77.6 kg)   SpO2 95%   BMI 30.29 kg/m      Assessment & Plan:  Kristen Arroyo comes in today with chief complaint of Annual Exam   Diagnosis and orders addressed:  1. Annual physical exam - CMP14+EGFR - CBC with Differential/Platelet - Lipid panel - TSH  2. Gynecologic exam normal - CMP14+EGFR - CBC with Differential/Platelet  3. Essential hypertension - CMP14+EGFR - CBC with Differential/Platelet  4. Mixed  hyperlipidemia - CMP14+EGFR - CBC with Differential/Platelet  5. Gastroesophageal reflux disease without esophagitis - CMP14+EGFR - CBC with Differential/Platelet  6. GAD (generalized anxiety disorder) - ALPRAZolam (XANAX) 0.5 MG tablet; Take 1 tablet (0.5 mg total) by mouth at bedtime as needed. for sleep  Dispense: 20 tablet; Refill: 1 - CMP14+EGFR - CBC with Differential/Platelet - ToxASSURE Select 13 (MW), Urine  7. Controlled substance agreement signed - ALPRAZolam (XANAX) 0.5 MG tablet; Take 1 tablet (0.5 mg total) by mouth at bedtime as needed. for sleep  Dispense: 20 tablet; Refill: 1 - CMP14+EGFR - CBC with Differential/Platelet - ToxASSURE Select 13 (  MW), Urine  8. Post-menopause - DG [facility] DEXA  9. Primary osteoarthritis involving multiple joints - Rheumatoid factor  10. Arthralgia of both hands - Rheumatoid factor   Labs pending Health Maintenance reviewed Diet and exercise encouraged  Follow up plan: 6 months   [name], FNP

## 2022-05-26 NOTE — Patient Instructions (Signed)
Health Maintenance After Age 65 After age 65, you are at a higher risk for certain long-term diseases and infections as well as injuries from falls. Falls are a major cause of broken bones and head injuries in people who are older than age 65. Getting regular preventive care can help to keep you healthy and well. Preventive care includes getting regular testing and making lifestyle changes as recommended by your health care provider. Talk with your health care provider about: Which screenings and tests you should have. A screening is a test that checks for a disease when you have no symptoms. A diet and exercise plan that is right for you. What should I know about screenings and tests to prevent falls? Screening and testing are the best ways to find a health problem early. Early diagnosis and treatment give you the best chance of managing medical conditions that are common after age 65. Certain conditions and lifestyle choices may make you more likely to have a fall. Your health care provider may recommend: Regular vision checks. Poor vision and conditions such as cataracts can make you more likely to have a fall. If you wear glasses, make sure to get your prescription updated if your vision changes. Medicine review. Work with your health care provider to regularly review all of the medicines you are taking, including over-the-counter medicines. Ask your health care provider about any side effects that may make you more likely to have a fall. Tell your health care provider if any medicines that you take make you feel dizzy or sleepy. Strength and balance checks. Your health care provider may recommend certain tests to check your strength and balance while standing, walking, or changing positions. Foot health exam. Foot pain and numbness, as well as not wearing proper footwear, can make you more likely to have a fall. Screenings, including: Osteoporosis screening. Osteoporosis is a condition that causes  the bones to get weaker and break more easily. Blood pressure screening. Blood pressure changes and medicines to control blood pressure can make you feel dizzy. Depression screening. You may be more likely to have a fall if you have a fear of falling, feel depressed, or feel unable to do activities that you used to do. Alcohol use screening. Using too much alcohol can affect your balance and may make you more likely to have a fall. Follow these instructions at home: Lifestyle Do not drink alcohol if: Your health care provider tells you not to drink. If you drink alcohol: Limit how much you have to: 0-1 drink a day for women. 0-2 drinks a day for men. Know how much alcohol is in your drink. In the [place] one drink equals one 12 oz bottle of beer (355 mL), one 5 oz glass of wine (148 mL), or one 1 oz glass of hard liquor (44 mL). Do not use any products that contain nicotine or tobacco. These products include cigarettes, chewing tobacco, and vaping devices, such as e-cigarettes. If you need help quitting, ask your health care provider. Activity  Follow a regular exercise program to stay fit. This will help you maintain your balance. Ask your health care provider what types of exercise are appropriate for you. If you need a cane or walker, use it as recommended by your health care provider. Wear supportive shoes that have nonskid soles. Safety  Remove any tripping hazards, such as rugs, cords, and clutter. Install safety equipment such as grab bars in bathrooms and safety rails on stairs. Keep rooms and walkways   well-lit. General instructions Talk with your health care provider about your risks for falling. Tell your health care provider if: You fall. Be sure to tell your health care provider about all falls, even ones that seem minor. You feel dizzy, tiredness (fatigue), or off-balance. Take over-the-counter and prescription medicines only as told by your health care provider. These include  supplements. Eat a healthy diet and maintain a healthy weight. A healthy diet includes low-fat dairy products, low-fat (lean) meats, and fiber from whole grains, beans, and lots of fruits and vegetables. Stay current with your vaccines. Schedule regular health, dental, and eye exams. Summary Having a healthy lifestyle and getting preventive care can help to protect your health and wellness after age 65. Screening and testing are the best way to find a health problem early and help you avoid having a fall. Early diagnosis and treatment give you the best chance for managing medical conditions that are more common for people who are older than age 65. Falls are a major cause of broken bones and head injuries in people who are older than age 65. Take precautions to prevent a fall at home. Work with your health care provider to learn what changes you can make to improve your health and wellness and to prevent falls. This information is not intended to replace advice given to you by your health care provider. Make sure you discuss any questions you have with your health care provider. Document Revised: 08/06/2020 Document Reviewed: 08/06/2020 Elsevier Patient Education  2023 Elsevier Inc.  

## 2022-05-26 NOTE — Addendum Note (Signed)
Addended by: Michaela Corner on: 05/26/2022 11:42 AM   Modules accepted: Orders

## 2022-05-26 NOTE — Addendum Note (Signed)
Addended by: [name] A on: 05/26/2022 12:04 PM   Modules accepted: Orders

## 2022-05-27 LAB — CYTOLOGY - PAP: Diagnosis: NEGATIVE

## 2022-05-27 LAB — CMP14+EGFR
ALT: 28 IU/L (ref 0–32)
AST: 26 IU/L (ref 0–40)
Albumin/Globulin Ratio: 2.3 — ABNORMAL HIGH (ref 1.2–2.2)
Albumin: 4.8 g/dL (ref 3.9–4.9)
Alkaline Phosphatase: 87 IU/L (ref 44–121)
BUN/Creatinine Ratio: 32 — ABNORMAL HIGH (ref 12–28)
BUN: 24 mg/dL (ref 8–27)
Bilirubin Total: 0.3 mg/dL (ref 0.0–1.2)
CO2: 21 mmol/L (ref 20–29)
Calcium: 9.5 mg/dL (ref 8.7–10.3)
Chloride: 103 mmol/L (ref 96–106)
Creatinine, Ser: 0.76 mg/dL (ref 0.57–1.00)
Globulin, Total: 2.1 g/dL (ref 1.5–4.5)
Glucose: 96 mg/dL (ref 70–99)
Potassium: 3.9 mmol/L (ref 3.5–5.2)
Sodium: 142 mmol/L (ref 134–144)
Total Protein: 6.9 g/dL (ref 6.0–8.5)
eGFR: 87 mL/min/{1.73_m2} (ref >59)

## 2022-05-27 LAB — CBC WITH DIFFERENTIAL/PLATELET
Basophils Absolute: 0 10*3/uL (ref 0.0–0.2)
Basos: 0 %
EOS (ABSOLUTE): 0.2 10*3/uL (ref 0.0–0.4)
Eos: 3 %
Hematocrit: 42.1 % (ref 34.0–46.6)
Hemoglobin: 14.2 g/dL (ref 11.1–15.9)
Immature Grans (Abs): 0 10*3/uL (ref 0.0–0.1)
Immature Granulocytes: 0 %
Lymphocytes Absolute: 1.2 10*3/uL (ref 0.7–3.1)
Lymphs: 17 %
MCH: 30.9 pg (ref 26.6–33.0)
MCHC: 33.7 g/dL (ref 31.5–35.7)
MCV: 92 fL (ref 79–97)
Monocytes Absolute: 0.3 10*3/uL (ref 0.1–0.9)
Monocytes: 5 %
Neutrophils Absolute: 5.5 10*3/uL (ref 1.4–7.0)
Neutrophils: 75 %
Platelets: 234 10*3/uL (ref 150–450)
RBC: 4.59 x10E6/uL (ref 3.77–5.28)
RDW: 12.1 % (ref 11.7–15.4)
WBC: 7.3 10*3/uL (ref 3.4–10.8)

## 2022-05-27 LAB — LIPID PANEL
Chol/HDL Ratio: 2.7 ratio (ref 0.0–4.4)
Cholesterol, Total: 132 mg/dL (ref 100–199)
HDL: 49 mg/dL (ref >39)
LDL Chol Calc (NIH): 63 mg/dL (ref 0–99)
Triglycerides: 108 mg/dL (ref 0–149)
VLDL Cholesterol Cal: 20 mg/dL (ref 5–40)

## 2022-05-27 LAB — RHEUMATOID FACTOR: Rheumatoid fact SerPl-aCnc: <10 IU/mL (ref <14.0)

## 2022-05-27 LAB — TSH: TSH: 1.1 u[IU]/mL (ref 0.450–4.500)

## 2022-05-27 LAB — TOXASSURE SELECT 13 (MW), URINE

## 2022-05-28 ENCOUNTER — Other Ambulatory Visit: Payer: Self-pay | Admitting: Family

## 2022-05-28 ENCOUNTER — Telehealth: Payer: Self-pay | Admitting: Family

## 2022-05-28 ENCOUNTER — Other Ambulatory Visit: Payer: Self-pay | Admitting: Cardiovascular Disease

## 2022-05-28 DIAGNOSIS — E782 Mixed hyperlipidemia: Secondary | ICD-10-CM

## 2022-06-05 ENCOUNTER — Ambulatory Visit (INDEPENDENT_AMBULATORY_CARE_PROVIDER_SITE_OTHER): Payer: Medicare HMO

## 2022-06-05 DIAGNOSIS — Z780 Asymptomatic menopausal state: Secondary | ICD-10-CM | POA: Diagnosis not present

## 2022-06-06 ENCOUNTER — Other Ambulatory Visit: Payer: Self-pay | Admitting: Family

## 2022-06-06 DIAGNOSIS — M85852 Other specified disorders of bone density and structure, left thigh: Secondary | ICD-10-CM | POA: Diagnosis not present

## 2022-06-06 DIAGNOSIS — M8580 Other specified disorders of bone density and structure, unspecified site: Secondary | ICD-10-CM

## 2022-06-06 DIAGNOSIS — M85851 Other specified disorders of bone density and structure, right thigh: Secondary | ICD-10-CM | POA: Diagnosis not present

## 2022-06-06 DIAGNOSIS — M85832 Other specified disorders of bone density and structure, left forearm: Secondary | ICD-10-CM | POA: Diagnosis not present

## 2022-06-23 ENCOUNTER — Ambulatory Visit (INDEPENDENT_AMBULATORY_CARE_PROVIDER_SITE_OTHER): Payer: Medicare HMO | Admitting: Family

## 2022-06-23 ENCOUNTER — Encounter: Payer: Self-pay | Admitting: Family

## 2022-06-23 VITALS — BP 122/75 | HR 80 | Temp 97.4°F | Ht 63.0 in | Wt 172.0 lb

## 2022-06-23 DIAGNOSIS — Z0000 Encounter for general adult medical examination without abnormal findings: Secondary | ICD-10-CM

## 2022-06-23 NOTE — Patient Instructions (Signed)
Health Maintenance After Age 65 After age 65, you are at a higher risk for certain long-term diseases and infections as well as injuries from falls. Falls are a major cause of broken bones and head injuries in people who are older than age 65. Getting regular preventive care can help to keep you healthy and well. Preventive care includes getting regular testing and making lifestyle changes as recommended by your health care provider. Talk with your health care provider about: Which screenings and tests you should have. A screening is a test that checks for a disease when you have no symptoms. A diet and exercise plan that is right for you. What should I know about screenings and tests to prevent falls? Screening and testing are the best ways to find a health problem early. Early diagnosis and treatment give you the best chance of managing medical conditions that are common after age 65. Certain conditions and lifestyle choices may make you more likely to have a fall. Your health care provider may recommend: Regular vision checks. Poor vision and conditions such as cataracts can make you more likely to have a fall. If you wear glasses, make sure to get your prescription updated if your vision changes. Medicine review. Work with your health care provider to regularly review all of the medicines you are taking, including over-the-counter medicines. Ask your health care provider about any side effects that may make you more likely to have a fall. Tell your health care provider if any medicines that you take make you feel dizzy or sleepy. Strength and balance checks. Your health care provider may recommend certain tests to check your strength and balance while standing, walking, or changing positions. Foot health exam. Foot pain and numbness, as well as not wearing proper footwear, can make you more likely to have a fall. Screenings, including: Osteoporosis screening. Osteoporosis is a condition that causes  the bones to get weaker and break more easily. Blood pressure screening. Blood pressure changes and medicines to control blood pressure can make you feel dizzy. Depression screening. You may be more likely to have a fall if you have a fear of falling, feel depressed, or feel unable to do activities that you used to do. Alcohol use screening. Using too much alcohol can affect your balance and may make you more likely to have a fall. Follow these instructions at home: Lifestyle Do not drink alcohol if: Your health care provider tells you not to drink. If you drink alcohol: Limit how much you have to: 0-1 drink a day for women. 0-2 drinks a day for men. Know how much alcohol is in your drink. In the [place] one drink equals one 12 oz bottle of beer (355 mL), one 5 oz glass of wine (148 mL), or one 1 oz glass of hard liquor (44 mL). Do not use any products that contain nicotine or tobacco. These products include cigarettes, chewing tobacco, and vaping devices, such as e-cigarettes. If you need help quitting, ask your health care provider. Activity  Follow a regular exercise program to stay fit. This will help you maintain your balance. Ask your health care provider what types of exercise are appropriate for you. If you need a cane or walker, use it as recommended by your health care provider. Wear supportive shoes that have nonskid soles. Safety  Remove any tripping hazards, such as rugs, cords, and clutter. Install safety equipment such as grab bars in bathrooms and safety rails on stairs. Keep rooms and walkways   well-lit. General instructions Talk with your health care provider about your risks for falling. Tell your health care provider if: You fall. Be sure to tell your health care provider about all falls, even ones that seem minor. You feel dizzy, tiredness (fatigue), or off-balance. Take over-the-counter and prescription medicines only as told by your health care provider. These include  supplements. Eat a healthy diet and maintain a healthy weight. A healthy diet includes low-fat dairy products, low-fat (lean) meats, and fiber from whole grains, beans, and lots of fruits and vegetables. Stay current with your vaccines. Schedule regular health, dental, and eye exams. Summary Having a healthy lifestyle and getting preventive care can help to protect your health and wellness after age 65. Screening and testing are the best way to find a health problem early and help you avoid having a fall. Early diagnosis and treatment give you the best chance for managing medical conditions that are more common for people who are older than age 65. Falls are a major cause of broken bones and head injuries in people who are older than age 65. Take precautions to prevent a fall at home. Work with your health care provider to learn what changes you can make to improve your health and wellness and to prevent falls. This information is not intended to replace advice given to you by your health care provider. Make sure you discuss any questions you have with your health care provider. Document Revised: 08/06/2020 Document Reviewed: 08/06/2020 Elsevier Patient Education  2023 Elsevier Inc.  

## 2022-06-23 NOTE — Progress Notes (Signed)
Subjective:    Kristen Arroyo is a 67 y.o. female who presents for a Welcome to Medicare exam.   Review of Systems Negative    Cardiac Risk Factors include: advanced age (>89men, >79 women);hypertension      Objective:    Today's Vitals   06/23/22 1143  BP: 122/75  Pulse: 80  Temp: (!) 97.4 F (36.3 C)  TempSrc: Temporal  SpO2: 96%  Weight: 172 lb (78 kg)  Height: 5\' 3"  (1.6 m)  Body mass index is 30.47 kg/m.  Medications Outpatient Encounter Medications as of 06/23/2022  Medication Sig   albuterol (VENTOLIN HFA) 108 (90 Base) MCG/ACT inhaler Inhale 2 puffs into the lungs every 6 (six) hours as needed for wheezing or shortness of breath.   ALPRAZolam (XANAX) 0.5 MG tablet Take 1 tablet (0.5 mg total) by mouth at bedtime as needed. for sleep   ascorbic acid (VITAMIN C) 500 MG tablet Take 500 mg by mouth daily.   aspirin EC 81 MG tablet Take 1 tablet (81 mg total) by mouth daily.   buPROPion (WELLBUTRIN SR) 150 MG 12 hr tablet Take 1 tablet (150 mg total) by mouth daily.   calcium-vitamin D (OSCAL WITH D) 500-200 MG-UNIT tablet Take 1 tablet by mouth daily.   Cholecalciferol (VITAMIN D-3) 125 MCG (5000 UT) TABS Take 5,000 Units by mouth daily.   diclofenac (VOLTAREN) 75 MG EC tablet Take 1 tablet (75 mg total) by mouth 2 (two) times daily.   dimenhyDRINATE (DRAMAMINE) 50 MG tablet Take 50 mg by mouth every 8 (eight) hours as needed.   ezetimibe (ZETIA) 10 MG tablet Take 1 tablet by mouth once daily   hydrochlorothiazide (HYDRODIURIL) 25 MG tablet Take 1 tablet (25 mg total) by mouth daily.   lactase (LACTAID) 3000 units tablet Take 3,000 Units by mouth as needed (STOMACH UPSET).   lisinopril (ZESTRIL) 5 MG tablet TAKE 1 TABLET BY MOUTH ONCE DAILY . APPOINTMENT REQUIRED FOR FUTURE REFILLS   loperamide (IMODIUM A-D) 2 MG tablet Take 2 mg by mouth 4 (four) times daily as needed for diarrhea or loose stools.   loratadine (CLARITIN) 10 MG tablet Take 1 tablet (10 mg total)  by mouth daily.   Misc Natural Products (ELDERBERRY IMMUNE COMPLEX) CHEW Chew by mouth.   Multiple Vitamin (MULTIVITAMIN) tablet Take 1 tablet by mouth daily.   omeprazole (PRILOSEC) 20 MG capsule Take 1 capsule by mouth once daily   rosuvastatin (CRESTOR) 40 MG tablet Take 1 tablet by mouth once daily   zinc gluconate 50 MG tablet Take 50 mg by mouth daily.   No facility-administered encounter medications on file as of 06/23/2022.     History: Past Medical History:  Diagnosis Date   Anxiety    Arthritis    knees   GERD (gastroesophageal reflux disease)    History of seasonal allergies    Hyperlipidemia    Hypertension    Past Surgical History:  Procedure Laterality Date   CARPAL TUNNEL RELEASE  2006   right   KNEE ARTHROSCOPY     left: torn meniscus    Family History  Problem Relation Age of Onset   Hypertension Mother    Peripheral vascular disease Mother    Heart failure Mother    Hypertension Father    Stroke Father        mini strokes   CAD Brother        CABG at age 83   Hypertension Brother    Diabetes Brother  CAD Brother        Stents in early 60's   Hypertension Brother    Multiple sclerosis Brother    Hypertension Brother    Asthma Brother    Hypertension Brother    Hypertension Brother    Colon cancer Neg Hx    Social History   Occupational History   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  Tobacco Use   Smoking status: Never   Smokeless tobacco: Never  Vaping Use   Vaping Use: Never used  Substance and Sexual Activity   Alcohol use: No   Drug use: No   Sexual activity: Not on file    Tobacco Counseling Counseling given: Not Answered   Immunizations and Health Maintenance Immunization History  Administered Date(s) Administered   Influenza, Quadrivalent, Recombinant, Inj, Pf 04/01/2018, 01/04/2019, 02/07/2021   Influenza,inj,Quad PF,6+ Mos 01/30/2020   Influenza-Unspecified 11/29/2015, 06/01/2017, 04/01/2018, 01/04/2019    Moderna Covid-19 Vaccine Bivalent Booster 30yrs & up 02/28/2021   Moderna Sars-Covid-2 Vaccination 06/23/2019, 07/21/2019, 03/01/2020   PNEUMOCOCCAL CONJUGATE-20 05/26/2022   Tdap 08/29/2013   Zoster Recombinat (Shingrix) 10/15/2017, 02/17/2018   Health Maintenance Due  Topic Date Due   Medicare Annual Wellness (AWV)  Never done   COVID-19 Vaccine (5 - 2023-24 season) 11/29/2021    Activities of Daily Living    06/23/2022   11:34 AM  In your present state of health, do you have any difficulty performing the following activities:  Hearing? 0  Vision? 0  Difficulty concentrating or making decisions? 0  Walking or climbing stairs? 0  Dressing or bathing? 0  Doing errands, shopping? 0  Preparing Food and eating ? N  Using the Toilet? N  In the past six months, have you accidently leaked urine? N  Do you have problems with loss of bowel control? N  Managing your Medications? N  Managing your Finances? N  Housekeeping or managing your Housekeeping? N    Physical Exam  WNL  Advanced Directives:      Assessment:    This is a routine wellness examination for this patient .   Vision/Hearing screen No results found.  Dietary issues and exercise activities discussed:  Current Exercise Habits: The patient does not participate in regular exercise at present, Exercise limited by: None identified   Goals   None    Depression Screen    06/23/2022   11:34 AM 02/04/2022    2:52 PM 05/23/2021    9:13 AM 08/23/2020    9:07 AM  PHQ 2/9 Scores  PHQ - 2 Score 0 0 0 0  PHQ- 9 Score 0 0 0 0     Fall Risk    06/23/2022   11:33 AM  [place] in the past year? 0  Number falls in past yr: 0  Injury with Fall? 0  Risk for fall due to : No Fall Risks  Follow up Falls evaluation completed    Cognitive Function:    06/23/2022   11:45 AM 06/23/2022   11:43 AM  MMSE - Mini Mental State Exam  Orientation to time 5 5  Orientation to Place 5 5  Registration 3 3   Attention/ Calculation 5 5  Recall 2   Language- name 2 objects 2   Language- repeat 1   Language- follow 3 step command 3   Language- read & follow direction 1   Write a sentence 1   Copy design 1   Total score 29  Patient Care Team: Sharion Balloon, FNP as PCP - General (Family Medicine) Sherren Mocha, MD as PCP - Cardiology (Cardiology)     Plan:     I have personally reviewed and noted the following in the patient's chart:   Medical and social history Use of alcohol, tobacco or illicit drugs  Current medications and supplements Functional ability and status Nutritional status Physical activity Advanced directives List of other physicians Hospitalizations, surgeries, and ER visits in previous 12 months Vitals Screenings to include cognitive, depression, and falls Referrals and appointments  In addition, I have reviewed and discussed with patient certain preventive protocols, quality metrics, and best practice recommendations. A written personalized care plan for preventive services as well as general preventive health recommendations were provided to patient.     [name], [place] 06/23/2022

## 2022-07-12 ENCOUNTER — Other Ambulatory Visit: Payer: Self-pay | Admitting: Family

## 2022-07-12 DIAGNOSIS — F3342 Major depressive disorder, recurrent, in full remission: Secondary | ICD-10-CM

## 2022-07-14 ENCOUNTER — Other Ambulatory Visit: Payer: Self-pay | Admitting: Family

## 2022-07-14 DIAGNOSIS — F3342 Major depressive disorder, recurrent, in full remission: Secondary | ICD-10-CM

## 2022-08-18 ENCOUNTER — Other Ambulatory Visit: Payer: Self-pay | Admitting: Family

## 2022-08-18 DIAGNOSIS — I10 Essential (primary) hypertension: Secondary | ICD-10-CM

## 2022-08-19 ENCOUNTER — Other Ambulatory Visit: Payer: Self-pay | Admitting: Family

## 2022-08-19 DIAGNOSIS — I10 Essential (primary) hypertension: Secondary | ICD-10-CM

## 2022-09-11 ENCOUNTER — Encounter: Payer: Self-pay | Admitting: Internal Medicine

## 2022-09-12 ENCOUNTER — Other Ambulatory Visit: Payer: Self-pay | Admitting: Family

## 2022-09-12 DIAGNOSIS — Z1231 Encounter for screening mammogram for malignant neoplasm of breast: Secondary | ICD-10-CM

## 2022-09-16 ENCOUNTER — Other Ambulatory Visit: Payer: Self-pay | Admitting: Family

## 2022-09-16 DIAGNOSIS — G5701 Lesion of sciatic nerve, right lower limb: Secondary | ICD-10-CM

## 2022-09-16 DIAGNOSIS — I10 Essential (primary) hypertension: Secondary | ICD-10-CM

## 2022-09-16 NOTE — Telephone Encounter (Signed)
[facility] NTBS in Aug for 6 mos FU RF sent to pharmacy

## 2022-09-17 ENCOUNTER — Ambulatory Visit
Admission: RE | Admit: 2022-09-17 | Discharge: 2022-09-17 | Disposition: A | Discharge status: Home / Self Care | Payer: Medicare HMO | Source: Ambulatory Visit | Attending: Family | Admitting: Family

## 2022-09-17 ENCOUNTER — Encounter: Payer: Self-pay | Admitting: Family

## 2022-09-17 DIAGNOSIS — Z1231 Encounter for screening mammogram for malignant neoplasm of breast: Secondary | ICD-10-CM

## 2022-09-17 NOTE — Telephone Encounter (Signed)
LEFT MESSAGE FOR PT TO CALL BACK AND SCHEDULE APPT FOR 6 MONTH [facility]

## 2022-10-09 ENCOUNTER — Other Ambulatory Visit: Payer: Self-pay | Admitting: Family

## 2022-10-09 DIAGNOSIS — F3342 Major depressive disorder, recurrent, in full remission: Secondary | ICD-10-CM

## 2022-10-27 ENCOUNTER — Other Ambulatory Visit: Payer: Self-pay | Admitting: Family

## 2022-10-27 DIAGNOSIS — G5701 Lesion of sciatic nerve, right lower limb: Secondary | ICD-10-CM

## 2022-10-27 DIAGNOSIS — I10 Essential (primary) hypertension: Secondary | ICD-10-CM

## 2022-10-30 ENCOUNTER — Encounter: Payer: Self-pay | Admitting: Family

## 2022-10-30 ENCOUNTER — Ambulatory Visit (INDEPENDENT_AMBULATORY_CARE_PROVIDER_SITE_OTHER): Payer: Medicare HMO | Admitting: Family

## 2022-10-30 VITALS — BP 122/82 | HR 97 | Temp 97.6°F | Ht 63.0 in | Wt 171.6 lb

## 2022-10-30 DIAGNOSIS — I10 Essential (primary) hypertension: Secondary | ICD-10-CM | POA: Diagnosis not present

## 2022-10-30 DIAGNOSIS — Z79899 Other long term (current) drug therapy: Secondary | ICD-10-CM

## 2022-10-30 DIAGNOSIS — K219 Gastro-esophageal reflux disease without esophagitis: Secondary | ICD-10-CM

## 2022-10-30 DIAGNOSIS — F3342 Major depressive disorder, recurrent, in full remission: Secondary | ICD-10-CM | POA: Diagnosis not present

## 2022-10-30 DIAGNOSIS — F411 Generalized anxiety disorder: Secondary | ICD-10-CM | POA: Diagnosis not present

## 2022-10-30 DIAGNOSIS — G5701 Lesion of sciatic nerve, right lower limb: Secondary | ICD-10-CM | POA: Diagnosis not present

## 2022-10-30 DIAGNOSIS — E782 Mixed hyperlipidemia: Secondary | ICD-10-CM | POA: Diagnosis not present

## 2022-10-30 MED ORDER — BUPROPION HCL ER (SR) 150 MG PO TB12
150.0000 mg | ORAL_TABLET | Freq: Every day | ORAL | 0 refills | Status: DC
Start: 2022-10-30 — End: 2023-04-06

## 2022-10-30 MED ORDER — DICLOFENAC SODIUM 75 MG PO TBEC: 75.0000 mg | DELAYED_RELEASE_TABLET | Freq: Two times a day (BID) | ORAL | 0 refills | Status: DC

## 2022-10-30 MED ORDER — ALPRAZOLAM 0.5 MG PO TABS
0.5000 mg | ORAL_TABLET | Freq: Every evening | ORAL | 1 refills | Status: DC | PRN
Start: 2022-10-30 — End: 2023-05-04

## 2022-10-30 MED ORDER — LISINOPRIL 5 MG PO TABS
ORAL_TABLET | ORAL | 0 refills | Status: DC
Start: 2022-10-30 — End: 2023-05-06

## 2022-10-30 MED ORDER — HYDROCHLOROTHIAZIDE 25 MG PO TABS
25.0000 mg | ORAL_TABLET | Freq: Every day | ORAL | 1 refills | Status: DC
Start: 2022-10-30 — End: 2023-04-10

## 2022-10-30 MED ORDER — EZETIMIBE 10 MG PO TABS: 10.0000 mg | ORAL_TABLET | Freq: Every day | ORAL | 6 refills | Status: DC

## 2022-10-30 NOTE — Progress Notes (Signed)
Subjective:    Patient ID: Kristen Arroyo, female    DOB: Feb 18, 1956, 67 y.o.   MRN: [id number]  Chief Complaint  Patient presents with   Medical Management of Chronic Issues    Pt presents to the office today for chronic follow up. She is followed by Cardiologists  annually for angina, HTN and family hx of CAD.  Hypertension This is a chronic problem. The current episode started more than 1 year ago. The problem has been resolved since onset. The problem is controlled. Associated symptoms include anxiety. Pertinent negatives include no malaise/fatigue, peripheral edema or shortness of breath. Risk factors for coronary artery disease include dyslipidemia and sedentary lifestyle. The current treatment provides moderate improvement.  Gastroesophageal Reflux She complains of belching and heartburn. This is a chronic problem. The problem occurs rarely. She has tried a PPI for the symptoms.  Depression        This is a chronic problem.  The current episode started more than 1 year ago.   Associated symptoms include helplessness, hopelessness, restlessness and sad.  Past medical history includes anxiety.   Hyperlipidemia This is a chronic problem. The current episode started more than 1 year ago. The problem is controlled. Recent lipid tests were reviewed and are normal. Pertinent negatives include no shortness of breath. Current antihyperlipidemic treatment includes statins. The current treatment provides moderate improvement of lipids. Risk factors for coronary artery disease include dyslipidemia, hypertension, a sedentary lifestyle and post-menopausal.  Anxiety Presents for follow-up visit. Symptoms include excessive worry, nervous/anxious behavior and restlessness. Patient reports no shortness of breath. Symptoms occur occasionally.        Review of Systems  Constitutional:  Negative for malaise/fatigue.  Respiratory:  Negative for shortness of breath.   Gastrointestinal:  Positive  for heartburn.  Psychiatric/Behavioral:  Positive for depression. The patient is nervous/anxious.   All other systems reviewed and are negative.      Objective:   Physical Exam Vitals reviewed.  Constitutional:      General: She is not in acute distress.    Appearance: She is well-developed.  HENT:     Head: Normocephalic and atraumatic.     Right Ear: Tympanic membrane normal.     Left Ear: Tympanic membrane normal.  Eyes:     Pupils: Pupils are equal, round, and reactive to light.  Neck:     Thyroid: No thyromegaly.  Cardiovascular:     Rate and Rhythm: Normal rate and regular rhythm.     Heart sounds: Normal heart sounds. No murmur heard. Pulmonary:     Effort: Pulmonary effort is normal. No respiratory distress.     Breath sounds: Normal breath sounds. No wheezing.  Abdominal:     General: Bowel sounds are normal. There is no distension.     Palpations: Abdomen is soft.     Tenderness: There is no abdominal tenderness.  Musculoskeletal:        General: No tenderness. Normal range of motion.     Cervical back: Normal range of motion and neck supple.  Skin:    General: Skin is warm and dry.  Neurological:     Mental Status: She is alert and oriented to person, place, and time.     Cranial Nerves: No cranial nerve deficit.     Deep Tendon Reflexes: Reflexes are normal and symmetric.  Psychiatric:        Behavior: Behavior normal.        Thought Content: Thought content normal.  Judgment: Judgment normal.       BP 122/82   Pulse 97   Temp 97.6 F (36.4 C) (Temporal)   Ht 5\' 3"  (1.6 m)   Wt 171 lb 9.6 oz (77.8 kg)   SpO2 97%   BMI 30.40 kg/m      Assessment & Plan:   Kristen Arroyo comes in today with chief complaint of Medical Management of Chronic Issues   Diagnosis and orders addressed:  1. GAD (generalized anxiety disorder) - ALPRAZolam (XANAX) 0.5 MG tablet; Take 1 tablet (0.5 mg total) by mouth at bedtime as needed. for sleep  Dispense:  20 tablet; Refill: 1 - CMP14+EGFR - CBC with Differential/Platelet  2. Controlled substance agreement signed - ALPRAZolam (XANAX) 0.5 MG tablet; Take 1 tablet (0.5 mg total) by mouth at bedtime as needed. for sleep  Dispense: 20 tablet; Refill: 1 - CMP14+EGFR - CBC with Differential/Platelet  3. Recurrent major depressive disorder, in full remission (HCC) - buPROPion (WELLBUTRIN SR) 150 MG 12 hr tablet; Take 1 tablet (150 mg total) by mouth daily.  Dispense: 90 tablet; Refill: 0 - CMP14+EGFR - CBC with Differential/Platelet  4. Piriformis syndrome of right side - diclofenac (VOLTAREN) 75 MG EC tablet; Take 1 tablet (75 mg total) by mouth 2 (two) times daily.  Dispense: 60 tablet; Refill: 0 - CMP14+EGFR - CBC with Differential/Platelet  5. Mixed hyperlipidemia - ezetimibe (ZETIA) 10 MG tablet; Take 1 tablet (10 mg total) by mouth daily.  Dispense: 30 tablet; Refill: 6 - CMP14+EGFR - CBC with Differential/Platelet  6. Essential hypertension - hydrochlorothiazide (HYDRODIURIL) 25 MG tablet; Take 1 tablet (25 mg total) by mouth daily.  Dispense: 30 tablet; Refill: 1 - lisinopril (ZESTRIL) 5 MG tablet; TAKE 1 TABLET BY MOUTH ONCE DAILY  Dispense: 90 tablet; Refill: 0 - CMP14+EGFR - CBC with Differential/Platelet  7. Gastroesophageal reflux disease without esophagitis - CMP14+EGFR - CBC with Differential/Platelet   Labs pending Patient reviewed in [place] controlled database, no flags noted. Contract and drug screen are up to date.  Health Maintenance reviewed Diet and exercise encouraged  Follow up plan: 6 months   Jannifer Rodney, FNP

## 2022-10-30 NOTE — Patient Instructions (Signed)
Health Maintenance After Age 65 After age 65, you are at a higher risk for certain long-term diseases and infections as well as injuries from falls. Falls are a major cause of broken bones and head injuries in people who are older than age 65. Getting regular preventive care can help to keep you healthy and well. Preventive care includes getting regular testing and making lifestyle changes as recommended by your health care provider. Talk with your health care provider about: Which screenings and tests you should have. A screening is a test that checks for a disease when you have no symptoms. A diet and exercise plan that is right for you. What should I know about screenings and tests to prevent falls? Screening and testing are the best ways to find a health problem early. Early diagnosis and treatment give you the best chance of managing medical conditions that are common after age 65. Certain conditions and lifestyle choices may make you more likely to have a fall. Your health care provider may recommend: Regular vision checks. Poor vision and conditions such as cataracts can make you more likely to have a fall. If you wear glasses, make sure to get your prescription updated if your vision changes. Medicine review. Work with your health care provider to regularly review all of the medicines you are taking, including over-the-counter medicines. Ask your health care provider about any side effects that may make you more likely to have a fall. Tell your health care provider if any medicines that you take make you feel dizzy or sleepy. Strength and balance checks. Your health care provider may recommend certain tests to check your strength and balance while standing, walking, or changing positions. Foot health exam. Foot pain and numbness, as well as not wearing proper footwear, can make you more likely to have a fall. Screenings, including: Osteoporosis screening. Osteoporosis is a condition that causes  the bones to get weaker and break more easily. Blood pressure screening. Blood pressure changes and medicines to control blood pressure can make you feel dizzy. Depression screening. You may be more likely to have a fall if you have a fear of falling, feel depressed, or feel unable to do activities that you used to do. Alcohol use screening. Using too much alcohol can affect your balance and may make you more likely to have a fall. Follow these instructions at home: Lifestyle Do not drink alcohol if: Your health care provider tells you not to drink. If you drink alcohol: Limit how much you have to: 0-1 drink a day for women. 0-2 drinks a day for men. Know how much alcohol is in your drink. In the [place] one drink equals one 12 oz bottle of beer (355 mL), one 5 oz glass of wine (148 mL), or one 1 oz glass of hard liquor (44 mL). Do not use any products that contain nicotine or tobacco. These products include cigarettes, chewing tobacco, and vaping devices, such as e-cigarettes. If you need help quitting, ask your health care provider. Activity  Follow a regular exercise program to stay fit. This will help you maintain your balance. Ask your health care provider what types of exercise are appropriate for you. If you need a cane or walker, use it as recommended by your health care provider. Wear supportive shoes that have nonskid soles. Safety  Remove any tripping hazards, such as rugs, cords, and clutter. Install safety equipment such as grab bars in bathrooms and safety rails on stairs. Keep rooms and walkways   well-lit. General instructions Talk with your health care provider about your risks for falling. Tell your health care provider if: You fall. Be sure to tell your health care provider about all falls, even ones that seem minor. You feel dizzy, tiredness (fatigue), or off-balance. Take over-the-counter and prescription medicines only as told by your health care provider. These include  supplements. Eat a healthy diet and maintain a healthy weight. A healthy diet includes low-fat dairy products, low-fat (lean) meats, and fiber from whole grains, beans, and lots of fruits and vegetables. Stay current with your vaccines. Schedule regular health, dental, and eye exams. Summary Having a healthy lifestyle and getting preventive care can help to protect your health and wellness after age 65. Screening and testing are the best way to find a health problem early and help you avoid having a fall. Early diagnosis and treatment give you the best chance for managing medical conditions that are more common for people who are older than age 65. Falls are a major cause of broken bones and head injuries in people who are older than age 65. Take precautions to prevent a fall at home. Work with your health care provider to learn what changes you can make to improve your health and wellness and to prevent falls. This information is not intended to replace advice given to you by your health care provider. Make sure you discuss any questions you have with your health care provider. Document Revised: 08/06/2020 Document Reviewed: 08/06/2020 Elsevier Patient Education  2024 Elsevier Inc.  

## 2022-11-03 ENCOUNTER — Ambulatory Visit: Payer: Medicare HMO | Admitting: Cardiovascular Disease

## 2022-11-11 ENCOUNTER — Other Ambulatory Visit: Payer: Self-pay | Admitting: Family

## 2022-11-17 ENCOUNTER — Encounter: Payer: Self-pay | Admitting: Cardiovascular Disease

## 2022-11-17 ENCOUNTER — Ambulatory Visit: Payer: Medicare HMO | Attending: Cardiovascular Disease | Admitting: Cardiovascular Disease

## 2022-11-17 VITALS — BP 116/86 | HR 63 | Ht 63.0 in | Wt 168.6 lb

## 2022-11-17 DIAGNOSIS — I10 Essential (primary) hypertension: Secondary | ICD-10-CM | POA: Diagnosis not present

## 2022-11-17 DIAGNOSIS — E782 Mixed hyperlipidemia: Secondary | ICD-10-CM

## 2022-11-17 DIAGNOSIS — I2510 Atherosclerotic heart disease of native coronary artery without angina pectoris: Secondary | ICD-10-CM

## 2022-11-17 MED ORDER — EZETIMIBE 10 MG PO TABS
10.0000 mg | ORAL_TABLET | Freq: Every day | ORAL | 3 refills | Status: DC
Start: 2022-11-17 — End: 2023-12-02

## 2022-11-17 MED ORDER — ROSUVASTATIN CALCIUM 40 MG PO TABS
40.0000 mg | ORAL_TABLET | Freq: Every day | ORAL | 3 refills | Status: DC
Start: 2022-11-17 — End: 2023-12-02

## 2022-11-17 NOTE — Patient Instructions (Signed)

## 2022-11-17 NOTE — Progress Notes (Signed)
Cardiology Office Note:    Date:  11/17/2022   ID:  Kristen Arroyo, DOB 02-May-1955, MRN [id number]  PCP:  Kristen Spencer, FNP   [facility] Providers Cardiologist:  Tonny Bollman, MD     Referring MD: Kristen Spencer, FNP   Chief Complaint  Patient presents with   Coronary Artery Disease    History of Present Illness:    Kristen Arroyo is a 67 y.o. female with a hx of: Coronary artery disease  Non-obs by CT in 2020 Hypertension  Hyperlipidemia  FHx of CAD   The patient is here with her husband today.  She is doing well from a cardiac perspective and denies any chest pain, chest pressure, heart palpitations, orthopnea, PND, lightheadedness, or syncope.  She has mild shortness of breath with activity and relates this to deconditioning.  She has not been engaged in any regular exercise.  At the time of her visit last year, she underwent a stress Myoview scan to evaluate her known moderate nonobstructive CAD.  This showed no ischemia and medical management was continued.  She has been compliant with her medications.  She follows regularly with her PCP.   Past Medical History:  Diagnosis Date   Anxiety    Arthritis    knees   GERD (gastroesophageal reflux disease)    History of seasonal allergies    Hyperlipidemia    Hypertension     Past Surgical History:  Procedure Laterality Date   CARPAL TUNNEL RELEASE  2006   right   KNEE ARTHROSCOPY     left: torn meniscus    Current Medications: Current Meds  Medication Sig   albuterol (VENTOLIN HFA) 108 (90 Base) MCG/ACT inhaler Inhale 2 puffs into the lungs every 6 (six) hours as needed for wheezing or shortness of breath.   ALPRAZolam (XANAX) 0.5 MG tablet Take 1 tablet (0.5 mg total) by mouth at bedtime as needed. for sleep   ascorbic acid (VITAMIN C) 500 MG tablet Take 500 mg by mouth daily.   aspirin EC 81 MG tablet Take 1 tablet (81 mg total) by mouth daily.   buPROPion (WELLBUTRIN SR) 150 MG 12 hr  tablet Take 1 tablet (150 mg total) by mouth daily.   calcium-vitamin D (OSCAL WITH D) 500-200 MG-UNIT tablet Take 1 tablet by mouth daily.   Cholecalciferol (VITAMIN D-3) 125 MCG (5000 UT) TABS Take 5,000 Units by mouth daily.   diclofenac (VOLTAREN) 75 MG EC tablet Take 1 tablet (75 mg total) by mouth 2 (two) times daily.   dimenhyDRINATE (DRAMAMINE) 50 MG tablet Take 50 mg by mouth every 8 (eight) hours as needed.   ezetimibe (ZETIA) 10 MG tablet Take 1 tablet (10 mg total) by mouth daily.   hydrochlorothiazide (HYDRODIURIL) 25 MG tablet Take 1 tablet (25 mg total) by mouth daily.   lactase (LACTAID) 3000 units tablet Take 3,000 Units by mouth as needed (STOMACH UPSET).   lisinopril (ZESTRIL) 5 MG tablet TAKE 1 TABLET BY MOUTH ONCE DAILY   loperamide (IMODIUM A-D) 2 MG tablet Take 2 mg by mouth 4 (four) times daily as needed for diarrhea or loose stools.   loratadine (CLARITIN) 10 MG tablet Take 1 tablet (10 mg total) by mouth daily.   Misc Natural Products (ELDERBERRY IMMUNE COMPLEX) CHEW Chew by mouth.   Multiple Vitamin (MULTIVITAMIN) tablet Take 1 tablet by mouth daily.   omeprazole (PRILOSEC) 20 MG capsule Take 1 capsule by mouth once daily   rosuvastatin (CRESTOR) 40  MG tablet Take 1 tablet by mouth once daily   zinc gluconate 50 MG tablet Take 50 mg by mouth daily.     Allergies:   Patient has no known allergies.   Social History   Socioeconomic History   Marital status: Married    Spouse name: Not on file   Number of children: 2   Years of education: Not on file   Highest education level: Not on file  Occupational History   Occupation: Event organiser: [employer]  Tobacco Use   Smoking status: Never   Smokeless tobacco: Never  Vaping Use   Vaping status: Never Used  Substance and Sexual Activity   Alcohol use: No   Drug use: No   Sexual activity: Not on file  Other Topics Concern   Not on file  Social History Narrative   Not on file   Social Determinants of  Health   Financial Resource Strain: Low Risk  (06/23/2022)   Overall Financial Resource Strain (CARDIA)    Difficulty of Paying Living Expenses: Not hard at all  Food Insecurity: No Food Insecurity (06/23/2022)   Hunger Vital Sign    Worried About Running Out of Food in the Last Year: Never true    Ran Out of Food in the Last Year: Never true  Transportation Needs: No Transportation Needs (06/23/2022)   PRAPARE - Administrator, Civil Service (Medical): No    Lack of Transportation (Non-Medical): No  Physical Activity: Insufficiently Active (06/23/2022)   Exercise Vital Sign    Days of Exercise per Week: 1 day    Minutes of Exercise per Session: 10 min  Stress: No Stress Concern Present (06/23/2022)   [employer] of Occupational Health - Occupational Stress Questionnaire    Feeling of Stress : Not at all  Social Connections: Moderately Isolated (06/23/2022)   Social Connection and Isolation Panel [NHANES]    Frequency of Communication with Friends and Family: More than three times a week    Frequency of Social Gatherings with Friends and Family: More than three times a week    Attends Religious Services: Never    Database administrator or Organizations: No    Attends Engineer, structural: Never    Marital Status: Married     Family History: The patient's family history includes Asthma in her brother; CAD in her brother and brother; Diabetes in her brother; Heart failure in her mother; Hypertension in her brother, brother, brother, brother, brother, father, and mother; Multiple sclerosis in her brother; Peripheral vascular disease in her mother; Stroke in her father. There is no history of Colon cancer or Breast cancer.  ROS:   Please see the history of present illness.    All other systems reviewed and are negative.  EKGs/Labs/Other Studies Reviewed:    The following studies were reviewed today: Myoview Stress Test 11-07-2021:   The patient reported dyspnea  during the stress test. Onset of symptoms occurred at stage 0.5 of the protocol. Symptoms began at minute 30 sec during stress and ended at minute 3 during recovery. Normal blood pressure and normal heart rate response noted during stress. Heart rate recovery was normal.   No ST deviation was noted. The ECG was not diagnostic due to pharmacologic protocol.   LV perfusion is normal. There is no evidence of ischemia. There is no evidence of infarction.   Left ventricular function is normal. Nuclear stress EF: 71 %. The left ventricular ejection fraction is  hyperdynamic (>65%).   The study is normal. The study is low risk.   Prior study not available for comparison.      Recent Labs: 05/26/2022: TSH 1.100 10/30/2022: ALT 23; BUN 23; Creatinine, Ser 0.74; Hemoglobin 13.5; Platelets 221; Potassium 4.3; Sodium 141  Recent Lipid Panel    Component Value Date/Time   CHOL 132 05/26/2022 1127   TRIG 108 05/26/2022 1127   HDL 49 05/26/2022 1127   CHOLHDL 2.7 05/26/2022 1127   LDLCALC 63 05/26/2022 1127     Risk Assessment/Calculations:                Physical Exam:    VS:  BP 116/86   Pulse 63   Ht 5\' 3"  (1.6 m)   Wt 168 lb 9.6 oz (76.5 kg)   SpO2 98%   BMI 29.87 kg/m     Wt Readings from Last 3 Encounters:  11/17/22 168 lb 9.6 oz (76.5 kg)  10/30/22 171 lb 9.6 oz (77.8 kg)  06/23/22 172 lb (78 kg)     GEN:  Well nourished, well developed in no acute distress HEENT: Normal NECK: No JVD; No carotid bruits LYMPHATICS: No lymphadenopathy CARDIAC: RRR, no murmurs, rubs, gallops RESPIRATORY:  Clear to auscultation without rales, wheezing or rhonchi  ABDOMEN: Soft, non-tender, non-distended MUSCULOSKELETAL:  No edema; No deformity  SKIN: Warm and dry NEUROLOGIC:  Alert and oriented x 3 PSYCHIATRIC:  Normal affect   ASSESSMENT:    1. Essential hypertension   2. Mixed hyperlipidemia   3. Coronary artery disease involving native coronary artery of native heart without angina  pectoris    PLAN:    In order of problems listed above:  Blood pressure controlled on low-dose lisinopril.  Discussed lifestyle modification with recommendations for a regular exercise/walking program. Treated with a combination of rosuvastatin and ezetimibe. Recent labs reviewed with a cholesterol of 132, HDL 49, LDL 63.  Continue current therapy.  Lifestyle modification reviewed as above. Stable without angina.  Treated with low-dose aspirin, aggressive lipid-lowering therapy, and an ACE inhibitor.     Medication Adjustments/Labs and Tests Ordered: Current medicines are reviewed at length with the patient today.  Concerns regarding medicines are outlined above.  Orders Placed This Encounter  Procedures   EKG 12-Lead   No orders of the defined types were placed in this encounter.   Patient Instructions  Medication Instructions:  Your physician recommends that you continue on your current medications as directed. Please refer to the Current Medication list given to you today.  *If you need a refill on your cardiac medications before your next appointment, please call your pharmacy*   Lab Work: NONE If you have labs (blood work) drawn today and your tests are completely normal, you will receive your results only by: MyChart Message (if you have MyChart) OR A paper copy in the mail If you have any lab test that is abnormal or we need to change your treatment, we will call you to review the results.   Testing/Procedures: NONE   Follow-Up: At [facility], you and your health needs are our priority.  As part of our continuing mission to provide you with exceptional heart care, we have created designated Provider Care Teams.  These Care Teams include your primary Cardiologist (physician) and Advanced Practice Providers (APPs -  Physician Assistants and Nurse Practitioners) who all work together to provide you with the care you need, when you need it.  We recommend  signing up for the  patient portal called "MyChart".  Sign up information is provided on this After Visit Summary.  MyChart is used to connect with patients for Virtual Visits (Telemedicine).  Patients are able to view lab/test results, encounter notes, upcoming appointments, etc.  Non-urgent messages can be sent to your provider as well.   To learn more about what you can do with MyChart, go to [email or web address].    Your next appointment:   1 year(s)  Provider:   Tonny Bollman, MD        Signed, Tonny Bollman, MD  11/17/2022 10:07 AM    [facility]

## 2023-02-16 ENCOUNTER — Other Ambulatory Visit: Payer: Self-pay | Admitting: Family

## 2023-02-16 DIAGNOSIS — G5701 Lesion of sciatic nerve, right lower limb: Secondary | ICD-10-CM

## 2023-03-31 DIAGNOSIS — H5213 Myopia, bilateral: Secondary | ICD-10-CM | POA: Diagnosis not present

## 2023-03-31 DIAGNOSIS — H524 Presbyopia: Secondary | ICD-10-CM | POA: Diagnosis not present

## 2023-04-06 ENCOUNTER — Other Ambulatory Visit: Payer: Self-pay | Admitting: Family

## 2023-04-06 DIAGNOSIS — F3342 Major depressive disorder, recurrent, in full remission: Secondary | ICD-10-CM

## 2023-04-06 NOTE — Telephone Encounter (Signed)
[facility] NTBS in Feb for 6 mos FU RF sent to pharmacy

## 2023-04-06 NOTE — Telephone Encounter (Signed)
 Appt 2-3 with Jannifer Rodney

## 2023-04-09 ENCOUNTER — Other Ambulatory Visit: Payer: Self-pay | Admitting: Family

## 2023-04-09 DIAGNOSIS — I10 Essential (primary) hypertension: Secondary | ICD-10-CM

## 2023-05-04 ENCOUNTER — Encounter: Payer: Self-pay | Admitting: Family

## 2023-05-04 ENCOUNTER — Ambulatory Visit (INDEPENDENT_AMBULATORY_CARE_PROVIDER_SITE_OTHER): Payer: Medicare HMO | Admitting: Family

## 2023-05-04 VITALS — BP 128/67 | HR 76 | Temp 97.1°F | Ht 63.0 in | Wt 172.4 lb

## 2023-05-04 DIAGNOSIS — I10 Essential (primary) hypertension: Secondary | ICD-10-CM | POA: Diagnosis not present

## 2023-05-04 DIAGNOSIS — E782 Mixed hyperlipidemia: Secondary | ICD-10-CM

## 2023-05-04 DIAGNOSIS — Z79899 Other long term (current) drug therapy: Secondary | ICD-10-CM

## 2023-05-04 DIAGNOSIS — F3342 Major depressive disorder, recurrent, in full remission: Secondary | ICD-10-CM | POA: Diagnosis not present

## 2023-05-04 DIAGNOSIS — K219 Gastro-esophageal reflux disease without esophagitis: Secondary | ICD-10-CM | POA: Diagnosis not present

## 2023-05-04 DIAGNOSIS — Z0001 Encounter for general adult medical examination with abnormal findings: Secondary | ICD-10-CM | POA: Diagnosis not present

## 2023-05-04 DIAGNOSIS — Z0000 Encounter for general adult medical examination without abnormal findings: Secondary | ICD-10-CM

## 2023-05-04 DIAGNOSIS — F411 Generalized anxiety disorder: Secondary | ICD-10-CM | POA: Diagnosis not present

## 2023-05-04 LAB — LIPID PANEL

## 2023-05-04 MED ORDER — ALPRAZOLAM 0.5 MG PO TABS
0.5000 mg | ORAL_TABLET | Freq: Every evening | ORAL | 1 refills | Status: DC | PRN
Start: 2023-05-04 — End: 2023-12-01

## 2023-05-04 NOTE — Progress Notes (Signed)
Subjective:    Patient ID: Kristen Arroyo, female    DOB: 22-Mar-1956, 68 y.o.   MRN: [id number]  Chief Complaint  Patient presents with   Medical Management of Chronic Issues    Right ear feeling achy     Pt presents to the office today for CPE and chronic follow up. She is followed by Cardiologists  annually for angina, HTN and family hx of CAD.  Hypertension This is a chronic problem. The current episode started more than 1 year ago. The problem has been resolved since onset. The problem is controlled. Associated symptoms include anxiety. Pertinent negatives include no malaise/fatigue, peripheral edema or shortness of breath. Risk factors for coronary artery disease include dyslipidemia and sedentary lifestyle. The current treatment provides moderate improvement.  Gastroesophageal Reflux She complains of belching and heartburn. This is a chronic problem. The current episode started more than 1 year ago. The problem occurs rarely. Risk factors include obesity. She has tried a PPI for the symptoms. The treatment provided moderate relief.  Depression        This is a chronic problem.  The current episode started more than 1 year ago.   Associated symptoms include helplessness, hopelessness, restlessness and sad.  Past medical history includes anxiety.   Hyperlipidemia This is a chronic problem. The current episode started more than 1 year ago. The problem is controlled. Recent lipid tests were reviewed and are normal. Pertinent negatives include no shortness of breath. Current antihyperlipidemic treatment includes statins. The current treatment provides moderate improvement of lipids. Risk factors for coronary artery disease include dyslipidemia, hypertension, a sedentary lifestyle and post-menopausal.  Anxiety Presents for follow-up visit. Symptoms include depressed mood, excessive worry, nervous/anxious behavior and restlessness. Patient reports no shortness of breath. Symptoms occur  occasionally.        Review of Systems  Constitutional:  Negative for malaise/fatigue.  Respiratory:  Negative for shortness of breath.   Gastrointestinal:  Positive for heartburn.  Psychiatric/Behavioral:  The patient is nervous/anxious.   All other systems reviewed and are negative.  Family History  Problem Relation Age of Onset   Hypertension Mother    Peripheral vascular disease Mother    Heart failure Mother    Hypertension Father    Stroke Father        mini strokes   CAD Brother        CABG at age 74   Hypertension Brother    Diabetes Brother    CAD Brother        Stents in early 60's   Hypertension Brother    Multiple sclerosis Brother    Hypertension Brother    Asthma Brother    Hypertension Brother    Hypertension Brother    Colon cancer Neg Hx    Breast cancer Neg Hx    Social History   Socioeconomic History   Marital status: Married    Spouse name: Not on file   Number of children: 2   Years of education: Not on file   Highest education level: Not on file  Occupational History   Occupation: Event organiser: [id number]  Tobacco Use   Smoking status: Never   Smokeless tobacco: Never  Vaping Use   Vaping status: Never Used  Substance and Sexual Activity   Alcohol use: No   Drug use: No   Sexual activity: Not on file  Other Topics Concern   Not on file  Social History Narrative   Not on file  Social Drivers of Corporate investment banker Strain: Low Risk  (06/23/2022)   Overall Financial Resource Strain (CARDIA)    Difficulty of Paying Living Expenses: Not hard at all  Food Insecurity: No Food Insecurity (06/23/2022)   Hunger Vital Sign    Worried About Running Out of Food in the Last Year: Never true    Ran Out of Food in the Last Year: Never true  Transportation Needs: No Transportation Needs (06/23/2022)   PRAPARE - Administrator, Civil Service (Medical): No    Lack of Transportation (Non-Medical): No  Physical Activity:  Insufficiently Active (06/23/2022)   Exercise Vital Sign    Days of Exercise per Week: 1 day    Minutes of Exercise per Session: 10 min  Stress: No Stress Concern Present (06/23/2022)   [employer] of Occupational Health - Occupational Stress Questionnaire    Feeling of Stress : Not at all  Social Connections: Moderately Isolated (06/23/2022)   Social Connection and Isolation Panel [NHANES]    Frequency of Communication with Friends and Family: More than three times a week    Frequency of Social Gatherings with Friends and Family: More than three times a week    Attends Religious Services: Never    Database administrator or Organizations: No    Attends Banker Meetings: Never    Marital Status: Married       Objective:   Physical Exam Vitals reviewed.  Constitutional:      General: She is not in acute distress.    Appearance: She is well-developed.  HENT:     Head: Normocephalic and atraumatic.     Right Ear: Tympanic membrane normal.     Left Ear: Tympanic membrane normal.  Eyes:     Pupils: Pupils are equal, round, and reactive to light.  Neck:     Thyroid: No thyromegaly.  Cardiovascular:     Rate and Rhythm: Normal rate and regular rhythm.     Heart sounds: Normal heart sounds. No murmur heard. Pulmonary:     Effort: Pulmonary effort is normal. No respiratory distress.     Breath sounds: Normal breath sounds. No wheezing.  Abdominal:     General: Bowel sounds are normal. There is no distension.     Palpations: Abdomen is soft.     Tenderness: There is no abdominal tenderness.  Musculoskeletal:        General: No tenderness. Normal range of motion.     Cervical back: Normal range of motion and neck supple.  Skin:    General: Skin is warm and dry.  Neurological:     Mental Status: She is alert and oriented to person, place, and time.     Cranial Nerves: No cranial nerve deficit.     Deep Tendon Reflexes: Reflexes are normal and symmetric.   Psychiatric:        Behavior: Behavior normal.        Thought Content: Thought content normal.        Judgment: Judgment normal.       BP 128/67   Pulse 76   Temp (!) 97.1 F (36.2 C)   Ht 5\' 3"  (1.6 m)   Wt 172 lb 6.4 oz (78.2 kg)   SpO2 96%   BMI 30.54 kg/m      Assessment & Plan:   Kristen Arroyo comes in today with chief complaint of Medical Management of Chronic Issues (Right ear feeling achy )  Diagnosis and orders addressed:  1. Annual physical exam (Primary) - CMP14+EGFR - CBC with Differential/Platelet - Lipid panel  2. Recurrent major depressive disorder, in full remission (HCC) - CMP14+EGFR - CBC with Differential/Platelet  3. Mixed hyperlipidemia - CMP14+EGFR - CBC with Differential/Platelet - Lipid panel  4. Gastroesophageal reflux disease without esophagitis - CMP14+EGFR - CBC with Differential/Platelet  5. GAD (generalized anxiety disorder) - ALPRAZolam (XANAX) 0.5 MG tablet; Take 1 tablet (0.5 mg total) by mouth at bedtime as needed. for sleep  Dispense: 20 tablet; Refill: 1 - CMP14+EGFR - CBC with Differential/Platelet  6. Essential hypertension - CMP14+EGFR - CBC with Differential/Platelet  7. Controlled substance agreement signed - ALPRAZolam (XANAX) 0.5 MG tablet; Take 1 tablet (0.5 mg total) by mouth at bedtime as needed. for sleep  Dispense: 20 tablet; Refill: 1 - CMP14+EGFR - CBC with Differential/Platelet   Labs pending Patient reviewed in [place] controlled database, no flags noted. Contract and drug screen are up to date.  Health Maintenance reviewed Diet and exercise encouraged  Follow up plan: 6 months   Jannifer Rodney, FNP

## 2023-05-04 NOTE — Patient Instructions (Signed)
 Health Maintenance After Age 67 After age 41, you are at a higher risk for certain long-term diseases and infections as well as injuries from falls. Falls are a major cause of broken bones and head injuries in people who are older than age 26. Getting regular preventive care can help to keep you healthy and well. Preventive care includes getting regular testing and making lifestyle changes as recommended by your health care provider. Talk with your health care provider about: Which screenings and tests you should have. A screening is a test that checks for a disease when you have no symptoms. A diet and exercise plan that is right for you. What should I know about screenings and tests to prevent falls? Screening and testing are the best ways to find a health problem early. Early diagnosis and treatment give you the best chance of managing medical conditions that are common after age 48. Certain conditions and lifestyle choices may make you more likely to have a fall. Your health care provider may recommend: Regular vision checks. Poor vision and conditions such as cataracts can make you more likely to have a fall. If you wear glasses, make sure to get your prescription updated if your vision changes. Medicine review. Work with your health care provider to regularly review all of the medicines you are taking, including over-the-counter medicines. Ask your health care provider about any side effects that may make you more likely to have a fall. Tell your health care provider if any medicines that you take make you feel dizzy or sleepy. Strength and balance checks. Your health care provider may recommend certain tests to check your strength and balance while standing, walking, or changing positions. Foot health exam. Foot pain and numbness, as well as not wearing proper footwear, can make you more likely to have a fall. Screenings, including: Osteoporosis screening. Osteoporosis is a condition that causes  the bones to get weaker and break more easily. Blood pressure screening. Blood pressure changes and medicines to control blood pressure can make you feel dizzy. Depression screening. You may be more likely to have a fall if you have a fear of falling, feel depressed, or feel unable to do activities that you used to do. Alcohol use screening. Using too much alcohol can affect your balance and may make you more likely to have a fall. Follow these instructions at home: Lifestyle Do not drink alcohol if: Your health care provider tells you not to drink. If you drink alcohol: Limit how much you have to: 0-1 drink a day for women. 0-2 drinks a day for men. Know how much alcohol is in your drink. In the [place] one drink equals one 12 oz bottle of beer (355 mL), one 5 oz glass of wine (148 mL), or one 1 oz glass of hard liquor (44 mL). Do not use any products that contain nicotine or tobacco. These products include cigarettes, chewing tobacco, and vaping devices, such as e-cigarettes. If you need help quitting, ask your health care provider. Activity  Follow a regular exercise program to stay fit. This will help you maintain your balance. Ask your health care provider what types of exercise are appropriate for you. If you need a cane or walker, use it as recommended by your health care provider. Wear supportive shoes that have nonskid soles. Safety  Remove any tripping hazards, such as rugs, cords, and clutter. Install safety equipment such as grab bars in bathrooms and safety rails on stairs. Keep rooms and walkways  well-lit. General instructions Talk with your health care provider about your risks for falling. Tell your health care provider if: You fall. Be sure to tell your health care provider about all falls, even ones that seem minor. You feel dizzy, tiredness (fatigue), or off-balance. Take over-the-counter and prescription medicines only as told by your health care provider. These include  supplements. Eat a healthy diet and maintain a healthy weight. A healthy diet includes low-fat dairy products, low-fat (lean) meats, and fiber from whole grains, beans, and lots of fruits and vegetables. Stay current with your vaccines. Schedule regular health, dental, and eye exams. Summary Having a healthy lifestyle and getting preventive care can help to protect your health and wellness after age 24. Screening and testing are the best way to find a health problem early and help you avoid having a fall. Early diagnosis and treatment give you the best chance for managing medical conditions that are more common for people who are older than age 81. Falls are a major cause of broken bones and head injuries in people who are older than age 75. Take precautions to prevent a fall at home. Work with your health care provider to learn what changes you can make to improve your health and wellness and to prevent falls. This information is not intended to replace advice given to you by your health care provider. Make sure you discuss any questions you have with your health care provider. Document Revised: 08/06/2020 Document Reviewed: 08/06/2020 Elsevier Patient Education  2024 [employer].

## 2023-05-05 LAB — CMP14+EGFR
ALT: 26 IU/L (ref 0–32)
AST: 26 IU/L (ref 0–40)
Albumin: 4.6 g/dL (ref 3.9–4.9)
Alkaline Phosphatase: 86 IU/L (ref 44–121)
BUN/Creatinine Ratio: 22 (ref 12–28)
BUN: 19 mg/dL (ref 8–27)
Bilirubin Total: 0.3 mg/dL (ref 0.0–1.2)
CO2: 22 mmol/L (ref 20–29)
Calcium: 9.6 mg/dL (ref 8.7–10.3)
Chloride: 104 mmol/L (ref 96–106)
Creatinine, Ser: 0.85 mg/dL (ref 0.57–1.00)
Globulin, Total: 2.2 g/dL (ref 1.5–4.5)
Glucose: 81 mg/dL (ref 70–99)
Potassium: 4.4 mmol/L (ref 3.5–5.2)
Sodium: 141 mmol/L (ref 134–144)
Total Protein: 6.8 g/dL (ref 6.0–8.5)
eGFR: 76 mL/min/{1.73_m2} (ref >59)

## 2023-05-05 LAB — CBC WITH DIFFERENTIAL/PLATELET
Basophils Absolute: 0 10*3/uL (ref 0.0–0.2)
Basos: 1 %
EOS (ABSOLUTE): 0.3 10*3/uL (ref 0.0–0.4)
Eos: 5 %
Hematocrit: 40.7 % (ref 34.0–46.6)
Hemoglobin: 13.7 g/dL (ref 11.1–15.9)
Immature Grans (Abs): 0 10*3/uL (ref 0.0–0.1)
Immature Granulocytes: 0 %
Lymphocytes Absolute: 1.5 10*3/uL (ref 0.7–3.1)
Lymphs: 27 %
MCH: 31.5 pg (ref 26.6–33.0)
MCHC: 33.7 g/dL (ref 31.5–35.7)
MCV: 94 fL (ref 79–97)
Monocytes Absolute: 0.4 10*3/uL (ref 0.1–0.9)
Monocytes: 7 %
Neutrophils Absolute: 3.3 10*3/uL (ref 1.4–7.0)
Neutrophils: 60 %
Platelets: 235 10*3/uL (ref 150–450)
RBC: 4.35 x10E6/uL (ref 3.77–5.28)
RDW: 11.9 % (ref 11.7–15.4)
WBC: 5.5 10*3/uL (ref 3.4–10.8)

## 2023-05-05 LAB — LIPID PANEL
Cholesterol, Total: 138 mg/dL (ref 100–199)
HDL: 50 mg/dL (ref >39)
LDL CALC COMMENT:: 2.8 ratio (ref 0.0–4.4)
LDL Chol Calc (NIH): 68 mg/dL (ref 0–99)
Triglycerides: 113 mg/dL (ref 0–149)
VLDL Cholesterol Cal: 20 mg/dL (ref 5–40)

## 2023-05-06 ENCOUNTER — Other Ambulatory Visit: Payer: Self-pay | Admitting: Family

## 2023-05-06 DIAGNOSIS — I10 Essential (primary) hypertension: Secondary | ICD-10-CM

## 2023-05-06 LAB — TOXASSURE SELECT 13 (MW), URINE

## 2023-07-02 ENCOUNTER — Other Ambulatory Visit: Payer: Self-pay | Admitting: Family

## 2023-07-02 DIAGNOSIS — G5701 Lesion of sciatic nerve, right lower limb: Secondary | ICD-10-CM

## 2023-07-02 DIAGNOSIS — F3342 Major depressive disorder, recurrent, in full remission: Secondary | ICD-10-CM

## 2023-08-06 ENCOUNTER — Other Ambulatory Visit: Payer: Self-pay | Admitting: Family

## 2023-08-06 DIAGNOSIS — I10 Essential (primary) hypertension: Secondary | ICD-10-CM

## 2023-09-30 ENCOUNTER — Encounter: Payer: Self-pay | Admitting: Family

## 2023-09-30 ENCOUNTER — Other Ambulatory Visit: Payer: Self-pay | Admitting: Family

## 2023-09-30 DIAGNOSIS — F3342 Major depressive disorder, recurrent, in full remission: Secondary | ICD-10-CM

## 2023-09-30 NOTE — Telephone Encounter (Signed)
 LMTCB to schedule appt Letter mailed

## 2023-09-30 NOTE — Telephone Encounter (Signed)
 Christy NTBS in Aug for 6 mos FU RF sent to pharmacy

## 2023-10-14 NOTE — Progress Notes (Unsigned)
 This encounter was created in error - please disregard.

## 2023-11-04 ENCOUNTER — Other Ambulatory Visit: Payer: Self-pay | Admitting: Family

## 2023-11-04 DIAGNOSIS — I10 Essential (primary) hypertension: Secondary | ICD-10-CM

## 2023-11-17 ENCOUNTER — Other Ambulatory Visit: Payer: Self-pay | Admitting: Family

## 2023-12-01 ENCOUNTER — Ambulatory Visit: Admitting: Family

## 2023-12-01 ENCOUNTER — Encounter: Payer: Self-pay | Admitting: Family

## 2023-12-01 VITALS — BP 132/85 | HR 76 | Temp 97.8°F | Ht 63.0 in | Wt 172.2 lb

## 2023-12-01 DIAGNOSIS — K219 Gastro-esophageal reflux disease without esophagitis: Secondary | ICD-10-CM | POA: Diagnosis not present

## 2023-12-01 DIAGNOSIS — Z79899 Other long term (current) drug therapy: Secondary | ICD-10-CM

## 2023-12-01 DIAGNOSIS — M150 Primary generalized (osteo)arthritis: Secondary | ICD-10-CM | POA: Diagnosis not present

## 2023-12-01 DIAGNOSIS — G5701 Lesion of sciatic nerve, right lower limb: Secondary | ICD-10-CM | POA: Diagnosis not present

## 2023-12-01 DIAGNOSIS — Z1211 Encounter for screening for malignant neoplasm of colon: Secondary | ICD-10-CM

## 2023-12-01 DIAGNOSIS — Z23 Encounter for immunization: Secondary | ICD-10-CM

## 2023-12-01 DIAGNOSIS — I10 Essential (primary) hypertension: Secondary | ICD-10-CM

## 2023-12-01 DIAGNOSIS — F3342 Major depressive disorder, recurrent, in full remission: Secondary | ICD-10-CM | POA: Diagnosis not present

## 2023-12-01 DIAGNOSIS — F411 Generalized anxiety disorder: Secondary | ICD-10-CM | POA: Diagnosis not present

## 2023-12-01 DIAGNOSIS — E782 Mixed hyperlipidemia: Secondary | ICD-10-CM

## 2023-12-01 MED ORDER — FLUTICASONE PROPIONATE 50 MCG/ACT NA SUSP: 2.0000 | Freq: Every day | NASAL | 6 refills | Status: AC

## 2023-12-01 MED ORDER — DICLOFENAC SODIUM 75 MG PO TBEC
75.0000 mg | DELAYED_RELEASE_TABLET | Freq: Two times a day (BID) | ORAL | 1 refills | Status: AC
Start: 2023-12-01 — End: ?

## 2023-12-01 MED ORDER — BUPROPION HCL ER (SR) 150 MG PO TB12: 150.0000 mg | ORAL_TABLET | Freq: Every day | ORAL | 0 refills | Status: DC

## 2023-12-01 MED ORDER — HYDROCHLOROTHIAZIDE 25 MG PO TABS
25.0000 mg | ORAL_TABLET | Freq: Every day | ORAL | 0 refills | Status: AC
Start: 2023-12-01 — End: ?

## 2023-12-01 MED ORDER — ALPRAZOLAM 0.5 MG PO TABS
0.5000 mg | ORAL_TABLET | Freq: Every evening | ORAL | 1 refills | Status: AC | PRN
Start: 2023-12-01 — End: ?

## 2023-12-01 MED ORDER — LISINOPRIL 5 MG PO TABS: 5.0000 mg | ORAL_TABLET | Freq: Every day | ORAL | 0 refills | Status: DC

## 2023-12-01 NOTE — Patient Instructions (Signed)
 Health Maintenance After Age 67 After age 27, you are at a higher risk for certain long-term diseases and infections as well as injuries from falls. Falls are a major cause of broken bones and head injuries in people who are older than age 73. Getting regular preventive care can help to keep you healthy and well. Preventive care includes getting regular testing and making lifestyle changes as recommended by your health care provider. Talk with your health care provider about: Which screenings and tests you should have. A screening is a test that checks for a disease when you have no symptoms. A diet and exercise plan that is right for you. What should I know about screenings and tests to prevent falls? Screening and testing are the best ways to find a health problem early. Early diagnosis and treatment give you the best chance of managing medical conditions that are common after age 90. Certain conditions and lifestyle choices may make you more likely to have a fall. Your health care provider may recommend: Regular vision checks. Poor vision and conditions such as cataracts can make you more likely to have a fall. If you wear glasses, make sure to get your prescription updated if your vision changes. Medicine review. Work with your health care provider to regularly review all of the medicines you are taking, including over-the-counter medicines. Ask your health care provider about any side effects that may make you more likely to have a fall. Tell your health care provider if any medicines that you take make you feel dizzy or sleepy. Strength and balance checks. Your health care provider may recommend certain tests to check your strength and balance while standing, walking, or changing positions. Foot health exam. Foot pain and numbness, as well as not wearing proper footwear, can make you more likely to have a fall. Screenings, including: Osteoporosis screening. Osteoporosis is a condition that causes  the bones to get weaker and break more easily. Blood pressure screening. Blood pressure changes and medicines to control blood pressure can make you feel dizzy. Depression screening. You may be more likely to have a fall if you have a fear of falling, feel depressed, or feel unable to do activities that you used to do. Alcohol  use screening. Using too much alcohol  can affect your balance and may make you more likely to have a fall. Follow these instructions at home: Lifestyle Do not drink alcohol  if: Your health care provider tells you not to drink. If you drink alcohol : Limit how much you have to: 0-1 drink a day for women. 0-2 drinks a day for men. Know how much alcohol  is in your drink. In the [place] one drink equals one 12 oz bottle of beer (355 mL), one 5 oz glass of wine (148 mL), or one 1 oz glass of hard liquor (44 mL). Do not use any products that contain nicotine or tobacco. These products include cigarettes, chewing tobacco, and vaping devices, such as e-cigarettes. If you need help quitting, ask your health care provider. Activity  Follow a regular exercise program to stay fit. This will help you maintain your balance. Ask your health care provider what types of exercise are appropriate for you. If you need a cane or walker, use it as recommended by your health care provider. Wear supportive shoes that have nonskid soles. Safety  Remove any tripping hazards, such as rugs, cords, and clutter. Install safety equipment such as grab bars in bathrooms and safety rails on stairs. Keep rooms and walkways  well-lit. General instructions Talk with your health care provider about your risks for falling. Tell your health care provider if: You fall. Be sure to tell your health care provider about all falls, even ones that seem minor. You feel dizzy, tiredness (fatigue), or off-balance. Take over-the-counter and prescription medicines only as told by your health care provider. These include  supplements. Eat a healthy diet and maintain a healthy weight. A healthy diet includes low-fat dairy products, low-fat (lean) meats, and fiber from whole grains, beans, and lots of fruits and vegetables. Stay current with your vaccines. Schedule regular health, dental, and eye exams. Summary Having a healthy lifestyle and getting preventive care can help to protect your health and wellness after age 15. Screening and testing are the best way to find a health problem early and help you avoid having a fall. Early diagnosis and treatment give you the best chance for managing medical conditions that are more common for people who are older than age 42. Falls are a major cause of broken bones and head injuries in people who are older than age 64. Take precautions to prevent a fall at home. Work with your health care provider to learn what changes you can make to improve your health and wellness and to prevent falls. This information is not intended to replace advice given to you by your health care provider. Make sure you discuss any questions you have with your health care provider. Document Revised: 08/06/2020 Document Reviewed: 08/06/2020 Elsevier Patient Education  2024 [employer].

## 2023-12-01 NOTE — Progress Notes (Signed)
 Subjective:    Patient ID: Kristen Arroyo, female    DOB: 1955-09-24, 68 y.o.   MRN: [id number]  Chief Complaint  Patient presents with   Medical Management of Chronic Issues    Pt presents to the office today for chronic follow up.   She is followed by Cardiologists  annually for angina, HTN and family hx of CAD.  Hypertension This is a chronic problem. The current episode started more than 1 year ago. The problem has been resolved since onset. The problem is controlled. Associated symptoms include anxiety and malaise/fatigue. Pertinent negatives include no peripheral edema (at times after standing for long periods for time) or shortness of breath. Risk factors for coronary artery disease include dyslipidemia and sedentary lifestyle. The current treatment provides moderate improvement.  Gastroesophageal Reflux She complains of belching and heartburn. This is a chronic problem. The current episode started more than 1 year ago. The problem occurs rarely. The symptoms are aggravated by certain foods. Risk factors include obesity. She has tried a PPI for the symptoms. The treatment provided moderate relief.  Depression        This is a chronic problem.  The current episode started more than 1 year ago.   Associated symptoms include restlessness and sad.  Associated symptoms include no helplessness and no hopelessness.  Treatments tried: wellbutrin .  Past medical history includes anxiety.   Hyperlipidemia This is a chronic problem. The current episode started more than 1 year ago. The problem is controlled. Recent lipid tests were reviewed and are normal. Pertinent negatives include no shortness of breath. Current antihyperlipidemic treatment includes statins. The current treatment provides moderate improvement of lipids. Risk factors for coronary artery disease include dyslipidemia, hypertension, a sedentary lifestyle and post-menopausal.  Anxiety Presents for follow-up visit. Symptoms include  depressed mood, excessive worry, nervous/anxious behavior and restlessness. Patient reports no shortness of breath. Symptoms occur occasionally.    Arthritis Presents for follow-up visit. She complains of pain and stiffness. Affected locations include the right MCP, left MCP, right knee, left knee, left foot and right foot. Her pain is at a severity of 7/10.      Review of Systems  Constitutional:  Positive for malaise/fatigue.  Respiratory:  Negative for shortness of breath.   Gastrointestinal:  Positive for heartburn.  Musculoskeletal:  Positive for arthritis and stiffness.  Psychiatric/Behavioral:  The patient is nervous/anxious.   All other systems reviewed and are negative.  Family History  Problem Relation Age of Onset   Hypertension Mother    Peripheral vascular disease Mother    Heart failure Mother    Hypertension Father    Stroke Father        mini strokes   CAD Brother        CABG at age 55   Hypertension Brother    Diabetes Brother    CAD Brother        Stents in early 60's   Hypertension Brother    Multiple sclerosis Brother    Hypertension Brother    Asthma Brother    Hypertension Brother    Hypertension Brother    Colon cancer Neg Hx    Breast cancer Neg Hx    Social History   Socioeconomic History   Marital status: Married    Spouse name: Not on file   Number of children: 2   Years of education: Not on file   Highest education level: Not on file  Occupational History   Occupation: Production designer, theatre/television/film  Employer: [id number]  Tobacco Use   Smoking status: Never   Smokeless tobacco: Never  Vaping Use   Vaping status: Never Used  Substance and Sexual Activity   Alcohol use: No   Drug use: No   Sexual activity: Not on file  Other Topics Concern   Not on file  Social History Narrative   Not on file   Social Drivers of Health   Financial Resource Strain: Low Risk  (06/23/2022)   Overall Financial Resource Strain (CARDIA)    Difficulty of Paying Living  Expenses: Not hard at all  Food Insecurity: No Food Insecurity (06/23/2022)   Hunger Vital Sign    Worried About Running Out of Food in the Last Year: Never true    Ran Out of Food in the Last Year: Never true  Transportation Needs: No Transportation Needs (06/23/2022)   PRAPARE - Administrator, Civil Service (Medical): No    Lack of Transportation (Non-Medical): No  Physical Activity: Insufficiently Active (06/23/2022)   Exercise Vital Sign    Days of Exercise per Week: 1 day    Minutes of Exercise per Session: 10 min  Stress: No Stress Concern Present (06/23/2022)   [employer] of Occupational Health - Occupational Stress Questionnaire    Feeling of Stress : Not at all  Social Connections: Moderately Isolated (06/23/2022)   Social Connection and Isolation Panel    Frequency of Communication with Friends and Family: More than three times a week    Frequency of Social Gatherings with Friends and Family: More than three times a week    Attends Religious Services: Never    Database administrator or Organizations: No    Attends Banker Meetings: Never    Marital Status: Married       Objective:   Physical Exam Vitals reviewed.  Constitutional:      General: She is not in acute distress.    Appearance: She is well-developed.  HENT:     Head: Normocephalic and atraumatic.     Right Ear: Tympanic membrane normal.     Left Ear: Tympanic membrane normal.  Eyes:     Pupils: Pupils are equal, round, and reactive to light.  Neck:     Thyroid : No thyromegaly.  Cardiovascular:     Rate and Rhythm: Normal rate and regular rhythm.     Heart sounds: Normal heart sounds. No murmur heard. Pulmonary:     Effort: Pulmonary effort is normal. No respiratory distress.     Breath sounds: Normal breath sounds. No wheezing.  Abdominal:     General: Bowel sounds are normal. There is no distension.     Palpations: Abdomen is soft.     Tenderness: There is no abdominal  tenderness.  Musculoskeletal:        General: No tenderness. Normal range of motion.     Cervical back: Normal range of motion and neck supple.  Skin:    General: Skin is warm and dry.  Neurological:     Mental Status: She is alert and oriented to person, place, and time.     Cranial Nerves: No cranial nerve deficit.     Deep Tendon Reflexes: Reflexes are normal and symmetric.  Psychiatric:        Behavior: Behavior normal.        Thought Content: Thought content normal.        Judgment: Judgment normal.       BP 132/85   Pulse 76  Temp 97.8 F (36.6 C) (Temporal)   Ht 5' 3 (1.6 m)   Wt 172 lb 3.2 oz (78.1 kg)   SpO2 97%   BMI 30.50 kg/m      Assessment & Plan:   Kristen Arroyo comes in today with chief complaint of Medical Management of Chronic Issues   Diagnosis and orders addressed:  1. GAD (generalized anxiety disorder) - ALPRAZolam  (XANAX ) 0.5 MG tablet; Take 1 tablet (0.5 mg total) by mouth at bedtime as needed. for sleep  Dispense: 20 tablet; Refill: 1 - CMP14+EGFR  2. Controlled substance agreement signed - ALPRAZolam  (XANAX ) 0.5 MG tablet; Take 1 tablet (0.5 mg total) by mouth at bedtime as needed. for sleep  Dispense: 20 tablet; Refill: 1 - CMP14+EGFR  3. Recurrent major depressive disorder, in full remission (HCC) - buPROPion  (WELLBUTRIN  SR) 150 MG 12 hr tablet; Take 1 tablet (150 mg total) by mouth daily.  Dispense: 90 tablet; Refill: 0 - CMP14+EGFR  4. Piriformis syndrome of right side - diclofenac  (VOLTAREN ) 75 MG EC tablet; Take 1 tablet (75 mg total) by mouth 2 (two) times daily.  Dispense: 180 tablet; Refill: 1 - CMP14+EGFR  5. Essential hypertension (Primary) - hydrochlorothiazide  (HYDRODIURIL ) 25 MG tablet; Take 1 tablet (25 mg total) by mouth daily.  Dispense: 90 tablet; Refill: 0 - lisinopril  (ZESTRIL ) 5 MG tablet; Take 1 tablet (5 mg total) by mouth daily.  Dispense: 90 tablet; Refill: 0 - CMP14+EGFR  6. Primary osteoarthritis  involving multiple joints - CMP14+EGFR  7. Mixed hyperlipidemia - CMP14+EGFR  8. Colon cancer screening - Ambulatory referral to Gastroenterology - CMP14+EGFR  9. Gastroesophageal reflux disease without esophagitis   Labs pending Patient reviewed in [employer] controlled database, no flags noted. Contract and drug screen are up to date.  Health Maintenance reviewed Diet and exercise encouraged  Follow up plan: 6 months   Bari Learn, FNP

## 2023-12-02 ENCOUNTER — Ambulatory Visit: Attending: Cardiovascular Disease | Admitting: Cardiovascular Disease

## 2023-12-02 ENCOUNTER — Encounter: Payer: Self-pay | Admitting: Cardiovascular Disease

## 2023-12-02 VITALS — BP 138/80 | HR 67 | Ht 63.0 in | Wt 176.0 lb

## 2023-12-02 DIAGNOSIS — I2510 Atherosclerotic heart disease of native coronary artery without angina pectoris: Secondary | ICD-10-CM | POA: Diagnosis not present

## 2023-12-02 DIAGNOSIS — E782 Mixed hyperlipidemia: Secondary | ICD-10-CM

## 2023-12-02 DIAGNOSIS — I10 Essential (primary) hypertension: Secondary | ICD-10-CM | POA: Diagnosis not present

## 2023-12-02 LAB — CMP14+EGFR
ALT: 36 IU/L — ABNORMAL HIGH (ref 0–32)
AST: 31 IU/L (ref 0–40)
Albumin: 4.6 g/dL (ref 3.9–4.9)
Alkaline Phosphatase: 93 IU/L (ref 44–121)
BUN/Creatinine Ratio: 20 (ref 12–28)
BUN: 19 mg/dL (ref 8–27)
Bilirubin Total: 0.3 mg/dL (ref 0.0–1.2)
CO2: 25 mmol/L (ref 20–29)
Calcium: 10.1 mg/dL (ref 8.7–10.3)
Chloride: 100 mmol/L (ref 96–106)
Creatinine, Ser: 0.97 mg/dL (ref 0.57–1.00)
Globulin, Total: 2.3 g/dL (ref 1.5–4.5)
Glucose: 89 mg/dL (ref 70–99)
Potassium: 5.1 mmol/L (ref 3.5–5.2)
Sodium: 141 mmol/L (ref 134–144)
Total Protein: 6.9 g/dL (ref 6.0–8.5)
eGFR: 64 mL/min/1.73 (ref >59)

## 2023-12-02 MED ORDER — ROSUVASTATIN CALCIUM 40 MG PO TABS
40.0000 mg | ORAL_TABLET | Freq: Every day | ORAL | 3 refills | Status: AC
Start: 2023-12-02 — End: ?

## 2023-12-02 MED ORDER — EZETIMIBE 10 MG PO TABS
10.0000 mg | ORAL_TABLET | Freq: Every day | ORAL | 3 refills | Status: AC
Start: 2023-12-02 — End: ?

## 2023-12-02 NOTE — Assessment & Plan Note (Signed)
 Blood pressure is well-controlled on hydrochlorothiazide  and lisinopril .  Last creatinine is 0.85 and potassium 4.4.  Labs were reviewed today.

## 2023-12-02 NOTE — Progress Notes (Signed)
 Cardiology Office Note:    Date:  12/02/2023   ID:  Kristen Arroyo, DOB 04-20-1955, MRN [id number]  PCP:  Lavell Bari LABOR, FNP   [facility] Providers Cardiologist:  Ozell Fell, MD     Referring MD: Lavell Bari LABOR, FNP   Chief Complaint  Patient presents with   Hypertension    History of Present Illness:    Kristen Arroyo is a 68 y.o. female with a hx of:  Coronary artery disease  Non-obs by CT in 2020 Hypertension  Hyperlipidemia  FHx of CAD  She's here with her husband today. Today, she denies symptoms of palpitations, chest pain, orthopnea, PND, lower extremity edema, dizziness, or syncope. She reports no changes in her medications. Continues on ASA, rosuvastatin , hydrochlorothiazide , and ezetimibe .  She has gained about 5 pounds over the past year and has mild shortness of breath with activity that she attributes to her weight gain.  Otherwise doing well without complaints.  Current Medications: Current Meds  Medication Sig   albuterol  (VENTOLIN  HFA) 108 (90 Base) MCG/ACT inhaler Inhale 2 puffs into the lungs every 6 (six) hours as needed for wheezing or shortness of breath.   ALPRAZolam  (XANAX ) 0.5 MG tablet Take 1 tablet (0.5 mg total) by mouth at bedtime as needed. for sleep   ascorbic acid (VITAMIN C) 500 MG tablet Take 500 mg by mouth daily.   aspirin  EC 81 MG tablet Take 1 tablet (81 mg total) by mouth daily.   buPROPion  (WELLBUTRIN  SR) 150 MG 12 hr tablet Take 1 tablet (150 mg total) by mouth daily.   calcium -vitamin D (OSCAL WITH D) 500-200 MG-UNIT tablet Take 1 tablet by mouth daily.   Cholecalciferol (VITAMIN D-3) 125 MCG (5000 UT) TABS Take 5,000 Units by mouth daily.   diclofenac  (VOLTAREN ) 75 MG EC tablet Take 1 tablet (75 mg total) by mouth 2 (two) times daily.   dimenhyDRINATE (DRAMAMINE) 50 MG tablet Take 50 mg by mouth every 8 (eight) hours as needed.   ezetimibe  (ZETIA ) 10 MG tablet Take 1 tablet (10 mg total) by mouth daily.    fluticasone  (FLONASE ) 50 MCG/ACT nasal spray Place 2 sprays into both nostrils daily.   hydrochlorothiazide  (HYDRODIURIL ) 25 MG tablet Take 1 tablet (25 mg total) by mouth daily.   lactase (LACTAID) 3000 units tablet Take 3,000 Units by mouth as needed (STOMACH UPSET).   lisinopril  (ZESTRIL ) 5 MG tablet Take 1 tablet (5 mg total) by mouth daily.   loperamide (IMODIUM A-D) 2 MG tablet Take 2 mg by mouth 4 (four) times daily as needed for diarrhea or loose stools.   loratadine  (CLARITIN ) 10 MG tablet Take 1 tablet (10 mg total) by mouth daily.   Misc Natural Products (ELDERBERRY IMMUNE COMPLEX) CHEW Chew by mouth.   Multiple Vitamin (MULTIVITAMIN) tablet Take 1 tablet by mouth daily.   omeprazole  (PRILOSEC) 20 MG capsule Take 1 capsule by mouth once daily   rosuvastatin  (CRESTOR ) 40 MG tablet Take 1 tablet (40 mg total) by mouth daily.   zinc gluconate 50 MG tablet Take 50 mg by mouth daily.   [DISCONTINUED] ezetimibe  (ZETIA ) 10 MG tablet Take 1 tablet (10 mg total) by mouth daily.   [DISCONTINUED] rosuvastatin  (CRESTOR ) 40 MG tablet Take 1 tablet (40 mg total) by mouth daily.     Allergies:   Patient has no known allergies.   ROS:   Please see the history of present illness.    All other systems reviewed and are negative.  EKGs/Labs/Other Studies Reviewed:    The following studies were reviewed today: Cardiac Studies & Procedures   ______________________________________________________________________________________________   STRESS TESTS  MYOCARDIAL PERFUSION IMAGING 11/07/2021  Interpretation Summary   The patient reported dyspnea during the stress test. Onset of symptoms occurred at stage 0.5 of the protocol. Symptoms began at minute 30 sec during stress and ended at minute 3 during recovery. Normal blood pressure and normal heart rate response noted during stress. Heart rate recovery was normal.   No ST deviation was noted. The ECG was not diagnostic due to pharmacologic  protocol.   LV perfusion is normal. There is no evidence of ischemia. There is no evidence of infarction.   Left ventricular function is normal. Nuclear stress EF: 71 %. The left ventricular ejection fraction is hyperdynamic (>65%).   The study is normal. The study is low risk.   Prior study not available for comparison.   ECHOCARDIOGRAM  ECHOCARDIOGRAM COMPLETE 12/16/2016  Narrative *[facility]* [address] [place], [place] [address] [phone]  ------------------------------------------------------------------- Transthoracic Echocardiography  Patient:    Arroyo, Kristen MR #:       [id number] Study Date: 12/16/2016 Gender:     F Age:        61 Height:     160 cm Weight:     76.7 kg BSA:        1.87 m^2 Pt. Status: Room:  ATTENDING    Redell Shallow SONOGRAPHER  [place], Will TISA Ferrier, Scott T REFERRING    Ferrier Hamilton T PERFORMING   [facility], Outpatient  cc:  ------------------------------------------------------------------- LV EF: 60% -   65%  ------------------------------------------------------------------- Indications:      (R07.9).  ------------------------------------------------------------------- History:   PMH:  Acquired from the patient and from the patient&'s chart.  Chest pain.  Risk factors:  Hypertension. Dyslipidemia.  ------------------------------------------------------------------- Study Conclusions  - Left ventricle: The cavity size was normal. Wall thickness was normal. Systolic function was normal. The estimated ejection fraction was in the range of 60% to 65%. Wall motion was normal; there were no regional wall motion abnormalities. Doppler parameters are consistent with abnormal left ventricular relaxation (grade 1 diastolic dysfunction). - Aortic valve: There was trivial regurgitation.  Impressions:  - Normal LV systolic function; mild diastolic dysfunction; mildly sclerotic aortic valve with trace  AI.  ------------------------------------------------------------------- Study data:  No prior study was available for comparison.  Study status:  Routine.  Procedure:  The patient reported no pain pre or post test. Transthoracic echocardiography for left ventricular function evaluation. Image quality was adequate.  Study completion: There were no complications.          Transthoracic echocardiography.  M-mode, complete 2D, spectral Doppler, and color Doppler.  Birthdate:  Patient birthdate: 1956-03-17.  Age:  Patient is 68 yr old.  Sex:  Gender: female.    BMI: 30 kg/m^2.  Blood pressure:     144/90  Patient status:  Outpatient.  Study date: Study date: 12/16/2016. Study time: 07:48 AM.  Location:  [facility]  -------------------------------------------------------------------  ------------------------------------------------------------------- Left ventricle:  The cavity size was normal. Wall thickness was normal. Systolic function was normal. The estimated ejection fraction was in the range of 60% to 65%. Wall motion was normal; there were no regional wall motion abnormalities. Doppler parameters are consistent with abnormal left ventricular relaxation (grade 1 diastolic dysfunction).  ------------------------------------------------------------------- Aortic valve:   Trileaflet; mildly thickened leaflets. Mobility was not restricted.  Doppler:  Transvalvular velocity was  within the normal range. There was no stenosis. There was trivial regurgitation.  ------------------------------------------------------------------- Aorta:  Aortic root: The aortic root was normal in size.  ------------------------------------------------------------------- Mitral valve:   Structurally normal valve.   Mobility was not restricted.  Doppler:  Transvalvular velocity was within the normal range. There was no evidence for stenosis. There was trivial regurgitation.    Peak gradient (D): 2  mm Hg.  ------------------------------------------------------------------- Left atrium:  The atrium was normal in size.  ------------------------------------------------------------------- Right ventricle:  The cavity size was normal. Systolic function was normal.  ------------------------------------------------------------------- Pulmonic valve:    Doppler:  Transvalvular velocity was within the normal range. There was no evidence for stenosis. There was trivial regurgitation.  ------------------------------------------------------------------- Tricuspid valve:   Structurally normal valve.    Doppler: Transvalvular velocity was within the normal range. There was trivial regurgitation.  ------------------------------------------------------------------- Pulmonary artery:   Systolic pressure was within the normal range.  ------------------------------------------------------------------- Right atrium:  The atrium was normal in size.  ------------------------------------------------------------------- Pericardium:  There was no pericardial effusion.  ------------------------------------------------------------------- Systemic veins: Inferior vena cava: The vessel was normal in size.  ------------------------------------------------------------------- Measurements  Left ventricle                           Value        Reference LV ID, ED, PLAX chordal          (L)     40.1  mm     43 - 52 LV ID, ES, PLAX chordal                  27.4  mm     23 - 38 LV fx shortening, PLAX chordal           32    %      >=29 LV PW thickness, ED                      8.05  mm     --------- IVS/LV PW ratio, ED                      1.28         <=1.3 Stroke volume, 2D                        67    ml     --------- Stroke volume/bsa, 2D                    36    ml/m^2 --------- LV ejection fraction, 1-p A4C            55    %      --------- LV end-diastolic volume, 2-p             76    ml      --------- LV end-systolic volume, 2-p              33    ml     --------- LV ejection fraction, 2-p                57    %      --------- Stroke volume, 2-p                       43    ml     --------- LV end-diastolic volume/bsa,  2-p         41    ml/m^2 --------- LV end-systolic volume/bsa, 2-p          18    ml/m^2 --------- Stroke volume/bsa, 2-p                   23    ml/m^2 --------- LV e&', lateral                           6.63  cm/s   --------- LV E/e&', lateral                         10.95        --------- LV e&', medial                            5.07  cm/s   --------- LV E/e&', medial                          14.32        --------- LV e&', average                           5.85  cm/s   --------- LV E/e&', average                         12.41        ---------  Ventricular septum                       Value        Reference IVS thickness, ED                        10.3  mm     ---------  LVOT                                     Value        Reference LVOT ID, S                               20    mm     --------- LVOT area                                3.14  cm^2   --------- LVOT ID                                  20    mm     --------- LVOT peak velocity, S                    86.5  cm/s   --------- LVOT mean velocity, S                    55.2  cm/s   --------- LVOT VTI, S  21.4  cm     --------- LVOT peak gradient, S                    3     mm Hg  --------- Stroke volume (SV), LVOT DP              67.2  ml     --------- Stroke index (SV/bsa), LVOT DP           35.9  ml/m^2 ---------  Aortic valve                             Value        Reference Aortic regurg pressure half-time         443   ms     ---------  Aorta                                    Value        Reference Aortic root ID, ED                       27    mm     --------- Ascending aorta ID, A-P, S               36    mm     ---------  Left atrium                               Value        Reference LA ID, A-P, ES                           32    mm     --------- LA ID/bsa, A-P                           1.71  cm/m^2 <=2.2 LA volume, S                             47    ml     --------- LA volume/bsa, S                         25.1  ml/m^2 --------- LA volume, ES, 1-p A4C                   34    ml     --------- LA volume/bsa, ES, 1-p A4C               18.2  ml/m^2 --------- LA volume, ES, 1-p A2C                   57    ml     --------- LA volume/bsa, ES, 1-p A2C               30.4  ml/m^2 ---------  Mitral valve                             Value        Reference Mitral E-wave peak velocity  72.6  cm/s   --------- Mitral A-wave peak velocity              104   cm/s   --------- Mitral deceleration time         (H)     356   ms     150 - 230 Mitral peak gradient, D                  2     mm Hg  --------- Mitral E/A ratio, peak                   0.7          ---------  Pulmonary arteries                       Value        Reference PA pressure, S, DP                       29    mm Hg  <=30  Tricuspid valve                          Value        Reference Tricuspid regurg peak velocity           253   cm/s   --------- Tricuspid peak RV-RA gradient            26    mm Hg  ---------  Systemic veins                           Value        Reference Estimated CVP                            3     mm Hg  ---------  Right ventricle                          Value        Reference RV pressure, S, DP                       29    mm Hg  <=30 RV s&', lateral, S                        14.1  cm/s   ---------  Legend: (L)  and  (H)  mark values outside specified reference range.  ------------------------------------------------------------------- Prepared and Electronically Authenticated by  Redell Shallow 2018-09-18T10:43:34      CT SCANS  CT CORONARY FRACTIONAL FLOW RESERVE DATA PREP 10/05/2018  Narrative EXAM: CT FFR ANALYSIS  CLINICAL DATA:   68 year old female with chest pain.  FINDINGS: FFRct analysis was performed on the original cardiac CT angiogram dataset. Diagrammatic representation of the FFRct analysis is provided in a separate PDF document in PACS. This dictation was created using the PDF document and an interactive 3D model of the results. 3D model is not available in the EMR/PACS. Normal FFR range is >0.80.  1. Left Main:  No significant stenosis.  2. LAD: No significant stenosis. 3. LCX: No significant stenosis. 4. RCA: No significant stenosis.  IMPRESSION: 1.  CT FFR analysis didn't show any significant stenosis.  Electronically Signed By: Leim Moose On: 10/06/2018 12:29   CT CORONARY MORPH W/CTA COR W/SCORE 10/04/2018  Addendum 10/04/2018  2:45 PM ADDENDUM REPORT: 10/04/2018 14:41  CLINICAL DATA:  68 year old female with h/o hyperlipidemia, hypertension, family history of premature CAD and atypical chest pain.  EXAM: Cardiac/Coronary  CT  TECHNIQUE: The patient was scanned on a [employer].  FINDINGS: A 120 kV prospective scan was triggered in the descending thoracic aorta at 111 HU's. Axial non-contrast 3 mm slices were carried out through the heart. The data set was analyzed on a dedicated work station and scored using the Agatson method. Gantry rotation speed was 250 msecs and collimation was .6 mm. 50 mg of PO metoprolol  and 0.8 mg of sl NTG was given. The 3D data set was reconstructed in 5% intervals of the 67-82 % of the R-R cycle. Diastolic phases were analyzed on a dedicated work station using MPR, MIP and VRT modes. The patient received 80 cc of contrast.  Aorta: Normal size with maximum diameter of the ascending aorta measuring 36 mm. Mild diffuse atherosclerotic plaque and calcifications. No dissection.  Aortic Valve: Trileaflet with mildly thickened and calcified leaflets.  Coronary Arteries:  Normal coronary origin.  Right dominance.  RCA is a large  dominant artery that gives rise to PDA and PLA. Ostial RCA has minimal plaque with stenosis 0-25%. Residual RCA/PLA/PDA have only luminal irregularities.  Left main is a large artery that gives rise to LAD, a ramus intermedius and LCX arteries. Left main has minimal ostial calcified plaque with stenosis 0-25%.  LAD is a large vessel that gives rise to one diagonal artery. There is moderate mixed plaque in the proximal portion with a focal stenosis 50-69%. Mid and distal LAD have only luminal irregularities.  D1 is a small artery with no significant plaque.  Ramus intermedius is a small branch with no obvious plaque.  LCX is a large lumen non-dominant artery that gives rise to two large OM branches. There is minimal plaque.  Other findings:  Normal pulmonary vein drainage into the left atrium.  Large left atrial appendage without a thrombus.  Normal size of the pulmonary artery.  IMPRESSION: 1. Coronary calcium  score of 634. This was 38 percentile for age and sex matched control.  2. Normal coronary origin with right dominance.  3. Moderate mixed plaque in the proximal LAD with a focal stenosis 50-69%. CAD RADS 3. Additional analysis with CT FFR will be submitted. Aggressive risk factor modification is recommended.   Electronically Signed By: Leim Moose On: 10/04/2018 14:41  Narrative EXAM: OVER-READ INTERPRETATION  CT CHEST  The following report is an over-read performed by radiologist Dr. Toribio Aye of [facility] on 10/04/2018. This over-read does not include interpretation of cardiac or coronary anatomy or pathology. The coronary calcium  score/coronary CTA interpretation by the cardiologist is attached.  COMPARISON:  None.  FINDINGS: Aortic atherosclerosis. Within the visualized portions of the thorax there are no suspicious appearing pulmonary nodules or masses, there is no acute consolidative airspace disease, no pleural effusions,  no pneumothorax and no lymphadenopathy. Visualized portions of the upper abdomen are unremarkable. There are no aggressive appearing lytic or blastic lesions noted in the visualized portions of the skeleton.  IMPRESSION: 1.  Aortic Atherosclerosis (ICD10-I70.0).  Electronically Signed: By: Toribio Aye M.D. On: 10/04/2018 10:45     ______________________________________________________________________________________________      EKG:   EKG Interpretation Date/Time:  Wednesday December 02 2023 08:49:11 EDT Ventricular Rate:  67 PR Interval:  186 QRS Duration:  82 QT Interval:  430 QTC Calculation: 454 R Axis:   57  Text Interpretation: Normal sinus rhythm Low voltage QRS When compared with ECG of 17-Nov-2022 09:36, Criteria for Septal infarct are no longer Present Confirmed by Wonda Sharper [id number]) on 12/02/2023 9:04:06 AM    Recent Labs: 05/04/2023: Hemoglobin 13.7; Platelets 235 12/01/2023: ALT 36; BUN 19; Creatinine, Ser 0.97; Potassium 5.1; Sodium 141  Recent Lipid Panel    Component Value Date/Time   CHOL 138 05/04/2023 1218   TRIG 113 05/04/2023 1218   HDL 50 05/04/2023 1218   CHOLHDL 2.8 05/04/2023 1218   LDLCALC 68 05/04/2023 1218     Risk Assessment/Calculations:                Physical Exam:    VS:  BP 138/80   Pulse 67   Ht 5' 3 (1.6 m)   Wt 176 lb (79.8 kg)   SpO2 99%   BMI 31.18 kg/m     Wt Readings from Last 3 Encounters:  12/02/23 176 lb (79.8 kg)  12/01/23 172 lb 3.2 oz (78.1 kg)  05/04/23 172 lb 6.4 oz (78.2 kg)     GEN:  Well nourished, well developed in no acute distress HEENT: Normal NECK: No JVD; No carotid bruits LYMPHATICS: No lymphadenopathy CARDIAC: RRR, no murmurs, rubs, gallops RESPIRATORY:  Clear to auscultation without rales, wheezing or rhonchi  ABDOMEN: Soft, non-tender, non-distended MUSCULOSKELETAL:  No edema; No deformity  SKIN: Warm and dry NEUROLOGIC:  Alert and oriented x 3 PSYCHIATRIC:  Normal affect    Assessment & Plan Coronary artery disease involving native coronary artery of native heart without angina pectoris Stable without symptoms of angina.  Continue aspirin  for antiplatelet therapy, and ACE inhibitor, and statin drug.  No changes are made today. Mixed hyperlipidemia Treated with rosuvastatin  and ezetimibe .  LDL cholesterol was 68.  Continue current management.  LFTs are normal. Essential hypertension Blood pressure is well-controlled on hydrochlorothiazide  and lisinopril .  Last creatinine is 0.85 and potassium 4.4.  Labs were reviewed today.     Medication Adjustments/Labs and Tests Ordered: Current medicines are reviewed at length with the patient today.  Concerns regarding medicines are outlined above.  Orders Placed This Encounter  Procedures   EKG 12-Lead   Meds ordered this encounter  Medications   ezetimibe  (ZETIA ) 10 MG tablet    Sig: Take 1 tablet (10 mg total) by mouth daily.    Dispense:  90 tablet    Refill:  3   rosuvastatin  (CRESTOR ) 40 MG tablet    Sig: Take 1 tablet (40 mg total) by mouth daily.    Dispense:  90 tablet    Refill:  3    Patient Instructions  Medication Instructions:  Your physician recommends that you continue on your current medications as directed. Please refer to the Current Medication list given to you today.  *If you need a refill on your cardiac medications before your next appointment, please call your pharmacy*  Lab Work: None ordered.  You may go to any [facility] Location for your lab work:  [facility] - [id number] ([facility]) - [phone] [address]  [place] - [address]   [place]  - [address]   [place] - [address] - [address] [employer]  [phone]  [employer] - [address] ([facility]   If you have labs (blood work) drawn today and your tests are completely  normal, you will receive your results only by: [employer] (if you have MyChart)  If you have any lab test that is abnormal or we need to change your treatment, we will call you or send a MyChart message to review the results.  Testing/Procedures: None ordered.  Follow-Up: At [facility], you and your health needs are our priority.  As part of our continuing mission to provide you with exceptional heart care, we have created designated Provider Care Teams.  These Care Teams include your primary Cardiologist (physician) and Advanced Practice Providers (APPs -  Physician Assistants and Nurse Practitioners) who all work together to provide you with the care you need, when you need it.   Your next appointment:   1 year(s)  The format for your next appointment:   In Person  Provider:   Ozell Fell, MD         Signed, Ozell Fell, MD  12/02/2023 12:47 PM    [facility]

## 2023-12-02 NOTE — Assessment & Plan Note (Signed)
 Treated with rosuvastatin  and ezetimibe .  LDL cholesterol was 68.  Continue current management.  LFTs are normal.

## 2023-12-02 NOTE — Patient Instructions (Signed)
 Medication Instructions:  Your physician recommends that you continue on your current medications as directed. Please refer to the Current Medication list given to you today.  *If you need a refill on your cardiac medications before your next appointment, please call your pharmacy*  Lab Work: None ordered.  You may go to any [facility] Location for your lab work:  [facility] - [id number] ([facility]) - [address] [phone] [address]  [place] - [address]   [place]  - [address]   [place] - [address] - [address] [employer]  - [address] ([facility]   If you have labs (blood work) drawn today and your tests are completely normal, you will receive your results only by: [employer] (if you have MyChart)  If you have any lab test that is abnormal or we need to change your treatment, we will call you or send a MyChart message to review the results.  Testing/Procedures: None ordered.  Follow-Up: At [facility], you and your health needs are our priority.  As part of our continuing mission to provide you with exceptional heart care, we have created designated Provider Care Teams.  These Care Teams include your primary Cardiologist (physician) and Advanced Practice Providers (APPs -  Physician Assistants and Nurse Practitioners) who all work together to provide you with the care you need, when you need it.  Your next appointment:   1 year(s)  The format for your next appointment:   In Person  Provider:   Ozell Fell, MD

## 2023-12-04 ENCOUNTER — Ambulatory Visit: Payer: Self-pay | Admitting: Family

## 2024-02-11 ENCOUNTER — Other Ambulatory Visit: Payer: Self-pay | Admitting: *Deleted

## 2024-02-12 ENCOUNTER — Other Ambulatory Visit: Payer: Self-pay | Admitting: *Deleted

## 2024-02-16 ENCOUNTER — Ambulatory Visit

## 2024-02-16 VITALS — BP 138/80 | HR 67 | Ht 63.0 in | Wt 176.0 lb

## 2024-02-16 DIAGNOSIS — Z0000 Encounter for general adult medical examination without abnormal findings: Secondary | ICD-10-CM

## 2024-02-16 NOTE — Progress Notes (Signed)
 Chief Complaint  Patient presents with   Medicare Wellness     Subjective:   Kristen Arroyo is a 68 y.o. female who presents for a Medicare Annual Wellness Visit.  Allergies (verified) Patient has no known allergies.   History: Past Medical History:  Diagnosis Date   Anxiety    Arthritis    knees   GERD (gastroesophageal reflux disease)    History of seasonal allergies    Hyperlipidemia    Hypertension    Past Surgical History:  Procedure Laterality Date   CARPAL TUNNEL RELEASE  2006   right   KNEE ARTHROSCOPY     left: torn meniscus   Family History  Problem Relation Age of Onset   Hypertension Mother    Peripheral vascular disease Mother    Heart failure Mother    Hypertension Father    Stroke Father        mini strokes   CAD Brother        CABG at age 35   Hypertension Brother    Diabetes Brother    CAD Brother        Stents in early 60's   Hypertension Brother    Multiple sclerosis Brother    Hypertension Brother    Asthma Brother    Hypertension Brother    Hypertension Brother    Colon cancer Neg Hx    Breast cancer Neg Hx    Social History   Occupational History   Occupation: Event Organiser: [id number]  Tobacco Use   Smoking status: Never   Smokeless tobacco: Never  Vaping Use   Vaping status: Never Used  Substance and Sexual Activity   Alcohol use: No   Drug use: No   Sexual activity: Not on file   Tobacco Counseling Counseling given: Yes  SDOH Screenings   Food Insecurity: No Food Insecurity (02/16/2024)  Housing: Unknown (02/16/2024)  Transportation Needs: No Transportation Needs (02/16/2024)  Utilities: Not At Risk (02/16/2024)  Alcohol Screen: Low Risk  (06/23/2022)  Depression (PHQ2-9): Low Risk  (02/16/2024)  Financial Resource Strain: Low Risk  (06/23/2022)  Physical Activity: Insufficiently Active (02/16/2024)  Social Connections: Socially Integrated (02/16/2024)  Stress: No Stress Concern Present (02/16/2024)   Tobacco Use: Low Risk  (02/16/2024)  Health Literacy: Adequate Health Literacy (02/16/2024)   See flowsheets for full screening details  Depression Screen PHQ 2 & 9 Depression Scale- Over the past 2 weeks, how often have you been bothered by any of the following problems? Little interest or pleasure in doing things: 0 Feeling down, depressed, or hopeless (PHQ Adolescent also includes...irritable): 0 PHQ-2 Total Score: 0 Trouble falling or staying asleep, or sleeping too much: 0 Feeling tired or having little energy: 0 Poor appetite or overeating (PHQ Adolescent also includes...weight loss): 0 Feeling bad about yourself - or that you are a failure or have let yourself or your family down: 0 Trouble concentrating on things, such as reading the newspaper or watching television (PHQ Adolescent also includes...like school work): 0 Moving or speaking so slowly that other people could have noticed. Or the opposite - being so fidgety or restless that you have been moving around a lot more than usual: 0 Thoughts that you would be better off dead, or of hurting yourself in some way: 0 PHQ-9 Total Score: 0 If you checked off any problems, how difficult have these problems made it for you to do your work, take care of things at home, or get along with  other people?: Not difficult at all  Depression Treatment Depression Interventions/Treatment : PHQ2-9 Score <4 Follow-up Not Indicated     Goals Addressed             This Visit's Progress    Stay Active and Independent-Low Back Pain         Visit info / Clinical Intake: Medicare Wellness Visit Type:: Subsequent Annual Wellness Visit Persons participating in visit:: patient Medicare Wellness Visit Mode:: Telephone If telephone:: video declined Because this visit was a virtual/telehealth visit:: vitals recorded from last visit If Telephone or Video please confirm:: I connected with the patient using audio enabled telemedicine application  and verified that I am speaking with the correct person using two identifiers Patient Location:: home Provider Location:: home office Information given by:: patient Interpreter Needed?: No Pre-visit prep was completed: yes AWV questionnaire completed by patient prior to visit?: no Living arrangements:: lives with spouse/significant other Patient's Overall Health Status Rating: very good Typical amount of pain: none Does pain affect daily life?: no Are you currently prescribed opioids?: no  Dietary Habits and Nutritional Risks How many meals a day?: 3 Eats fruit and vegetables daily?: yes Most meals are obtained by: preparing own meals In the last 2 weeks, have you had any of the following?: none Diabetic:: no  Functional Status Activities of Daily Living (to include ambulation/medication): Independent Ambulation: Independent Medication Administration: Independent Home Management: Independent Manage your own finances?: yes Primary transportation is: driving Concerns about hearing?: no  Fall Screening Falls in the past year?: 0 Number of falls in past year: 0 Was there an injury with Fall?: 0 Fall Risk Category Calculator: 0 Patient Fall Risk Level: Low Fall Risk  Fall Risk Patient at Risk for Falls Due to: No Fall Risks Fall risk Follow up: Falls evaluation completed; Education provided  Home and Transportation Safety: All rugs have non-skid backing?: yes All stairs or steps have railings?: yes Grab bars in the bathtub or shower?: yes Have non-skid surface in bathtub or shower?: yes Good home lighting?: yes Regular seat belt use?: yes Hospital stays in the last year:: no  Cognitive Assessment Difficulty concentrating, remembering, or making decisions? : no Will 6CIT or Mini Cog be Completed: yes What year is it?: 0 points What month is it?: 0 points Give patient an address phrase to remember (5 components): [address], [facility] About what time is it?: 0  points Count backwards from 20 to 1: 0 points Say the months of the year in reverse: 0 points Repeat the address phrase from earlier: 0 points 6 CIT Score: 0 points  Advance Directives (For Healthcare) Does Patient Have a Medical Advance Directive?: No  Reviewed/Updated  Reviewed/Updated: Reviewed All (Medical, Surgical, Family, Medications, Allergies, Care Teams, Patient Goals); Medical History; Surgical History; Family History; Medications; Allergies; Care Teams; Patient Goals        Objective:    Today's Vitals   02/16/24 1257  BP: 138/80  Pulse: 67  Weight: 176 lb (79.8 kg)  Height: 5' 3 (1.6 m)   Body mass index is 31.18 kg/m.  Current Medications (verified) Outpatient Encounter Medications as of 02/16/2024  Medication Sig   albuterol  (VENTOLIN  HFA) 108 (90 Base) MCG/ACT inhaler Inhale 2 puffs into the lungs every 6 (six) hours as needed for wheezing or shortness of breath.   ALPRAZolam  (XANAX ) 0.5 MG tablet Take 1 tablet (0.5 mg total) by mouth at bedtime as needed. for sleep   ascorbic acid (VITAMIN C) 500 MG  tablet Take 500 mg by mouth daily.   aspirin  EC 81 MG tablet Take 1 tablet (81 mg total) by mouth daily.   buPROPion  (WELLBUTRIN  SR) 150 MG 12 hr tablet Take 1 tablet (150 mg total) by mouth daily.   calcium -vitamin D (OSCAL WITH D) 500-200 MG-UNIT tablet Take 1 tablet by mouth daily.   Cholecalciferol (VITAMIN D-3) 125 MCG (5000 UT) TABS Take 5,000 Units by mouth daily.   diclofenac  (VOLTAREN ) 75 MG EC tablet Take 1 tablet (75 mg total) by mouth 2 (two) times daily.   dimenhyDRINATE (DRAMAMINE) 50 MG tablet Take 50 mg by mouth every 8 (eight) hours as needed.   ezetimibe  (ZETIA ) 10 MG tablet Take 1 tablet (10 mg total) by mouth daily.   fluticasone  (FLONASE ) 50 MCG/ACT nasal spray Place 2 sprays into both nostrils daily.   hydrochlorothiazide  (HYDRODIURIL ) 25 MG tablet Take 1 tablet (25 mg total) by mouth daily.   lactase (LACTAID) 3000 units tablet Take 3,000  Units by mouth as needed (STOMACH UPSET).   lisinopril  (ZESTRIL ) 5 MG tablet Take 1 tablet (5 mg total) by mouth daily.   loperamide (IMODIUM A-D) 2 MG tablet Take 2 mg by mouth 4 (four) times daily as needed for diarrhea or loose stools.   loratadine  (CLARITIN ) 10 MG tablet Take 1 tablet (10 mg total) by mouth daily.   Misc Natural Products (ELDERBERRY IMMUNE COMPLEX) CHEW Chew by mouth.   Multiple Vitamin (MULTIVITAMIN) tablet Take 1 tablet by mouth daily.   omeprazole  (PRILOSEC) 20 MG capsule Take 1 capsule by mouth once daily   rosuvastatin  (CRESTOR ) 40 MG tablet Take 1 tablet (40 mg total) by mouth daily.   zinc gluconate 50 MG tablet Take 50 mg by mouth daily.   No facility-administered encounter medications on file as of 02/16/2024.   Hearing/Vision screen Hearing Screening - Comments:: Pt denies hearing dif Vision Screening - Comments:: Pt wear glasses/last ov w/2024 pt goes [facility] in [place], [place] Immunizations and Health Maintenance Health Maintenance  Topic Date Due   Colonoscopy  10/12/2022   COVID-19 Vaccine (6 - 2025-26 season) 11/30/2023   Influenza Vaccine  06/28/2024 (Originally 10/30/2023)   DEXA SCAN  06/05/2024   Mammogram  09/16/2024   Medicare Annual Wellness (AWV)  02/15/2025   DTaP/Tdap/Td (3 - Td or Tdap) 11/30/2033   Pneumococcal Vaccine: 50+ Years  Completed   Hepatitis C Screening  Completed   Zoster Vaccines- Shingrix  Completed   Meningococcal B Vaccine  Aged Out        Assessment/Plan:  This is a routine wellness examination for Kristen Arroyo.  Patient Care Team: Lavell Bari LABOR, FNP as PCP - General (Family Medicine) Wonda Sharper, MD as PCP - Cardiology (Cardiology)  I have personally reviewed and noted the following in the patient's chart:   Medical and social history Use of alcohol, tobacco or illicit drugs  Current medications and supplements including opioid prescriptions. Functional ability and status Nutritional status Physical  activity Advanced directives List of other physicians Hospitalizations, surgeries, and ER visits in previous 12 months Vitals Screenings to include cognitive, depression, and falls Referrals and appointments  No orders of the defined types were placed in this encounter.  In addition, I have reviewed and discussed with patient certain preventive protocols, quality metrics, and best practice recommendations. A written personalized care plan for preventive services as well as general preventive health recommendations were provided to patient.   Ozie Ned, CMA   02/16/2024   Return in 1 year (  on 02/15/2025).  After Visit Summary: (MyChart) Due to this being a telephonic visit, the after visit summary with patients personalized plan was offered to patient via MyChart   Nurse Notes: Pt is aware and due the following: covid, colonoscopy; per pt will need to wait until the following yr due to co-pay

## 2024-02-23 ENCOUNTER — Other Ambulatory Visit: Payer: Self-pay | Admitting: Family

## 2024-03-28 ENCOUNTER — Other Ambulatory Visit: Payer: Self-pay | Admitting: Family

## 2024-03-28 DIAGNOSIS — F3342 Major depressive disorder, recurrent, in full remission: Secondary | ICD-10-CM

## 2024-04-12 ENCOUNTER — Other Ambulatory Visit: Payer: Self-pay | Admitting: Family

## 2024-04-12 NOTE — Progress Notes (Signed)
 PT comes to office with husband after hospital discharge. 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 Note written.

## 2024-04-28 ENCOUNTER — Other Ambulatory Visit: Payer: Self-pay | Admitting: Family

## 2024-04-28 DIAGNOSIS — I10 Essential (primary) hypertension: Secondary | ICD-10-CM

## 2024-04-28 NOTE — Telephone Encounter (Signed)
 Christy NTBS in March for 6 mos FU RF sent to pharmacy

## 2024-04-29 NOTE — Telephone Encounter (Signed)
 Appt 05-31-24 with Bari

## 2024-05-03 ENCOUNTER — Encounter (HOSPITAL_COMMUNITY): Payer: Self-pay | Admitting: *Deleted

## 2024-05-03 ENCOUNTER — Ambulatory Visit: Payer: Self-pay

## 2024-05-03 ENCOUNTER — Emergency Department (HOSPITAL_COMMUNITY)
Admission: EM | Admit: 2024-05-03 | Discharge: 2024-05-03 | Disposition: A | Discharge status: Home / Self Care | Source: Ambulatory Visit | Attending: Emergency Medicine | Admitting: Emergency Medicine

## 2024-05-03 ENCOUNTER — Other Ambulatory Visit: Payer: Self-pay

## 2024-05-03 ENCOUNTER — Emergency Department (HOSPITAL_COMMUNITY)

## 2024-05-03 DIAGNOSIS — G510 Bell's palsy: Secondary | ICD-10-CM | POA: Insufficient documentation

## 2024-05-03 DIAGNOSIS — Z7982 Long term (current) use of aspirin: Secondary | ICD-10-CM | POA: Insufficient documentation

## 2024-05-03 DIAGNOSIS — Z79899 Other long term (current) drug therapy: Secondary | ICD-10-CM | POA: Insufficient documentation

## 2024-05-03 DIAGNOSIS — I10 Essential (primary) hypertension: Secondary | ICD-10-CM | POA: Insufficient documentation

## 2024-05-03 LAB — COMPREHENSIVE METABOLIC PANEL WITH GFR
ALT: 28 U/L (ref 0–44)
AST: 28 U/L (ref 15–41)
Albumin: 4.6 g/dL (ref 3.5–5.0)
Alkaline Phosphatase: 91 U/L (ref 38–126)
Anion gap: 16 — ABNORMAL HIGH (ref 5–15)
BUN: 19 mg/dL (ref 8–23)
CO2: 21 mmol/L — ABNORMAL LOW (ref 22–32)
Calcium: 9.6 mg/dL (ref 8.9–10.3)
Chloride: 103 mmol/L (ref 98–111)
Creatinine, Ser: 0.73 mg/dL (ref 0.44–1.00)
GFR, Estimated: >60 mL/min
Glucose, Bld: 92 mg/dL (ref 70–99)
Potassium: 3.7 mmol/L (ref 3.5–5.1)
Sodium: 140 mmol/L (ref 135–145)
Total Bilirubin: 0.4 mg/dL (ref 0.0–1.2)
Total Protein: 7.4 g/dL (ref 6.5–8.1)

## 2024-05-03 LAB — CBC
HCT: 41.6 % (ref 36.0–46.0)
Hemoglobin: 14.1 g/dL (ref 12.0–15.0)
MCH: 31.1 pg (ref 26.0–34.0)
MCHC: 33.9 g/dL (ref 30.0–36.0)
MCV: 91.8 fL (ref 80.0–100.0)
Platelets: 234 10*3/uL (ref 150–400)
RBC: 4.53 MIL/uL (ref 3.87–5.11)
RDW: 11.9 % (ref 11.5–15.5)
WBC: 6 10*3/uL (ref 4.0–10.5)
nRBC: 0 % (ref 0.0–0.2)

## 2024-05-03 LAB — APTT: aPTT: 25 s (ref 24–36)

## 2024-05-03 LAB — DIFFERENTIAL
Abs Immature Granulocytes: 0.03 10*3/uL (ref 0.00–0.07)
Basophils Absolute: 0 10*3/uL (ref 0.0–0.1)
Basophils Relative: 1 %
Eosinophils Absolute: 0.1 10*3/uL (ref 0.0–0.5)
Eosinophils Relative: 2 %
Immature Granulocytes: 1 %
Lymphocytes Relative: 16 %
Lymphs Abs: 1 10*3/uL (ref 0.7–4.0)
Monocytes Absolute: 0.3 10*3/uL (ref 0.1–1.0)
Monocytes Relative: 5 %
Neutro Abs: 4.6 10*3/uL (ref 1.7–7.7)
Neutrophils Relative %: 75 %

## 2024-05-03 LAB — URINE DRUG SCREEN
Amphetamines: NEGATIVE
Barbiturates: NEGATIVE
Benzodiazepines: POSITIVE — AB
Cocaine: NEGATIVE
Fentanyl: NEGATIVE
Methadone Scn, Ur: NEGATIVE
Opiates: NEGATIVE
Tetrahydrocannabinol: NEGATIVE

## 2024-05-03 LAB — ETHANOL: Alcohol, Ethyl (B): <15 mg/dL

## 2024-05-03 LAB — PROTIME-INR
INR: 1 (ref 0.8–1.2)
Prothrombin Time: 13.7 s (ref 11.4–15.2)

## 2024-05-03 MED ORDER — PREDNISONE 20 MG PO TABS: ORAL_TABLET | ORAL | 0 refills | Status: AC

## 2024-05-03 MED ORDER — PREDNISONE 50 MG PO TABS
60.0000 mg | ORAL_TABLET | Freq: Once | ORAL | Status: AC
  Administered 2024-05-03: 60 mg via ORAL
  Filled 2024-05-03: qty 1

## 2024-05-03 MED ORDER — VALACYCLOVIR HCL 1 G PO TABS: 1000.0000 mg | ORAL_TABLET | Freq: Three times a day (TID) | ORAL | 0 refills | Status: AC

## 2024-05-03 MED ORDER — VALACYCLOVIR HCL 500 MG PO TABS
1000.0000 mg | ORAL_TABLET | Freq: Once | ORAL | Status: AC
  Administered 2024-05-03: 1000 mg via ORAL
  Filled 2024-05-03: qty 2

## 2024-05-03 NOTE — Telephone Encounter (Signed)
 FYI Only or Action Required?: FYI only for provider: ED advised.  Patient was last seen in primary care on 12/01/2023 by Lavell Bari LABOR, FNP.  Called Nurse Triage reporting Neurologic Problem.  Symptoms began yesterday.  Interventions attempted: Nothing.  Symptoms are: unchanged.  Triage Disposition: Go to ED Now (or PCP Triage)  Patient/caregiver understands and will follow disposition?: Yes   Message from [facility] S sent at 05/03/2024  1:25 PM EST  Reason for Triage: Hypertention today with tingling in her lips. On yesterday 05/02/24 patient noticed a difference in her smile where smile was crooked with slight droop, right eye is further down. Her mouth is pulled to the left. Her left side appears normal where the right side is drooping. Patient states her BP today was 181/106 and after rechecking 159/90 and 142/91.    Reason for Disposition  Bell's palsy suspected (i.e., weakness on only one side of the face, developing over hours to days, no other symptoms)    No appts available, advised ED now.  Answer Assessment - Initial Assessment Questions Advised ED now, advised someone to drive.  Advised call back or ED/911 if symptoms occur/worsen: severe diff breathing, chest pain > 5 min, faint. Severe HA, changes vision, confusion, diff speech/ walking, numbness/weakness one side of body. Patient verbalized understanding.    1. SYMPTOM: What is the main symptom you are concerned about? (e.g., weakness, numbness)   Right side weakness, drooping face, unable close eye, lightheadedness Last BP 142/91 HR 89 2. ONSET: When did this start? (e.g., minutes, hours, days; while sleeping)     yesterday 3. LAST NORMAL: When was the last time you (the patient) were normal (no symptoms)?     Yesterday morning 4. PATTERN Does this come and go, or has it been constant since it started?  Is it present now?     Constant 5. CARDIAC SYMPTOMS: Have you had any of the following symptoms: chest  pain, difficulty breathing, palpitations?     no 6. NEUROLOGIC SYMPTOMS: Have you had any of the following symptoms: headache, dizziness, vision loss, double vision, changes in speech, unsteady on your feet?     Denies diff breathing, chest pain, HA, dizziness, changes vision, diff speech, unsteadiness feet, weakness/numbness one side of body 7. OTHER SYMPTOMS: Do you have any other symptoms?    denies  Protocols used: Neurologic Deficit-A-AH

## 2024-05-03 NOTE — ED Triage Notes (Signed)
 Pt noted right facial droop since yesterday morning.  HA on Sunday. Tingling to lips. Denies any weakness or numbness to one side or not.

## 2024-05-03 NOTE — Telephone Encounter (Signed)
 FYI: noted

## 2024-05-04 NOTE — ED Provider Notes (Signed)
 " [place] EMERGENCY DEPARTMENT AT [facility] Provider Note   CSN: [id number] Arrival date & time: 05/03/24  1528     Patient presents with: Facial Droop   Kristen Arroyo is a 69 y.o. female.   Pt is a 69 yo female with pmhx significant for htn, anxiety, gerd, arthritis, and hld.  Pt noticed a droop to the right side of her face yesterday.  She did have a headache on 2/1.  She denies any other neuro sx.  No rash.       Prior to Admission medications  Medication Sig Start Date End Date Taking? Authorizing Provider  predniSONE  (DELTASONE ) 20 MG tablet Take 3 tablets (60 mg total) by mouth daily for 5 days, THEN 2 tablets (40 mg total) daily for 3 days, THEN 1 tablet (20 mg total) daily for 3 days. 05/03/24 05/14/24 Yes Dean Clarity, MD  valACYclovir  (VALTREX ) 1000 MG tablet Take 1 tablet (1,000 mg total) by mouth 3 (three) times daily. 05/03/24  Yes Dean Clarity, MD  albuterol  (VENTOLIN  HFA) 108 (90 Base) MCG/ACT inhaler Inhale 2 puffs into the lungs every 6 (six) hours as needed for wheezing or shortness of breath. 05/21/20   Lavell Lye A, FNP  ALPRAZolam  (XANAX ) 0.5 MG tablet Take 1 tablet (0.5 mg total) by mouth at bedtime as needed. for sleep 12/01/23   Lavell Lye A, FNP  ascorbic acid (VITAMIN C) 500 MG tablet Take 500 mg by mouth daily.    [provider]  aspirin  EC 81 MG tablet Take 1 tablet (81 mg total) by mouth daily. 12/09/16   Lelon Hamilton T, PA-C  buPROPion  (WELLBUTRIN  SR) 150 MG 12 hr tablet Take 1 tablet by mouth once daily 03/29/24   Lavell Lye A, FNP  calcium -vitamin D (OSCAL WITH D) 500-200 MG-UNIT tablet Take 1 tablet by mouth daily.    [provider]  Cholecalciferol (VITAMIN D-3) 125 MCG (5000 UT) TABS Take 5,000 Units by mouth daily.    [provider]  diclofenac  (VOLTAREN ) 75 MG EC tablet Take 1 tablet (75 mg total) by mouth 2 (two) times daily. 12/01/23   Lavell Lye LABOR, FNP  dimenhyDRINATE (DRAMAMINE) 50 MG  tablet Take 50 mg by mouth every 8 (eight) hours as needed.    [provider]  ezetimibe  (ZETIA ) 10 MG tablet Take 1 tablet (10 mg total) by mouth daily. 12/02/23   Wonda Sharper, MD  fluticasone  (FLONASE ) 50 MCG/ACT nasal spray Place 2 sprays into both nostrils daily. 12/01/23   Lavell Lye LABOR, FNP  hydrochlorothiazide  (HYDRODIURIL ) 25 MG tablet Take 1 tablet (25 mg total) by mouth daily. 12/01/23   Lavell Lye A, FNP  lactase (LACTAID) 3000 units tablet Take 3,000 Units by mouth as needed (STOMACH UPSET).    [provider]  lisinopril  (ZESTRIL ) 5 MG tablet Take 1 tablet by mouth once daily 04/28/24   Lavell Lye A, FNP  loperamide (IMODIUM A-D) 2 MG tablet Take 2 mg by mouth 4 (four) times daily as needed for diarrhea or loose stools.    [provider]  loratadine  (CLARITIN ) 10 MG tablet Take 1 tablet (10 mg total) by mouth daily. 04/03/17   Joshua Clayborne RAMAN, PA-C  Misc Natural Products (ELDERBERRY IMMUNE COMPLEX) CHEW Chew by mouth.    [provider]  Multiple Vitamin (MULTIVITAMIN) tablet Take 1 tablet by mouth daily.    [provider]  omeprazole  (PRILOSEC) 20 MG capsule Take 1 capsule by mouth once daily 02/11/24  Hawks, Christy A, FNP  rosuvastatin  (CRESTOR ) 40 MG tablet Take 1 tablet (40 mg total) by mouth daily. 12/02/23   Wonda Sharper, MD  zinc gluconate 50 MG tablet Take 50 mg by mouth daily.    [provider]    Allergies: Patient has no known allergies.    Review of Systems  Neurological:  Positive for facial asymmetry.  All other systems reviewed and are negative.   Updated Vital Signs BP 118/81   Pulse 73   Temp 98.5 F (36.9 C) (Oral)   Resp 18   Ht 5' 3 (1.6 m)   Wt 74.8 kg   SpO2 95%   BMI 29.23 kg/m   Physical Exam Vitals and nursing note reviewed.  Constitutional:      Appearance: Normal appearance.  HENT:     Head: Normocephalic and atraumatic.     Right Ear: External ear normal.     Left Ear:  External ear normal.     Nose: Nose normal.     Mouth/Throat:     Mouth: Mucous membranes are moist.     Pharynx: Oropharynx is clear.  Eyes:     Extraocular Movements: Extraocular movements intact.     Conjunctiva/sclera: Conjunctivae normal.     Pupils: Pupils are equal, round, and reactive to light.  Cardiovascular:     Rate and Rhythm: Normal rate and regular rhythm.     Pulses: Normal pulses.     Heart sounds: Normal heart sounds.  Pulmonary:     Effort: Pulmonary effort is normal.     Breath sounds: Normal breath sounds.  Abdominal:     General: Abdomen is flat. Bowel sounds are normal.     Palpations: Abdomen is soft.  Musculoskeletal:        General: Normal range of motion.     Cervical back: Normal range of motion and neck supple.  Skin:    General: Skin is warm.     Capillary Refill: Capillary refill takes less than 2 seconds.  Neurological:     Mental Status: She is alert and oriented to person, place, and time.     Comments: Right sided facial droop  Psychiatric:        Mood and Affect: Mood normal.        Behavior: Behavior normal.     (all labs ordered are listed, but only abnormal results are displayed) Labs Reviewed  COMPREHENSIVE METABOLIC PANEL WITH GFR - Abnormal; Notable for the following components:      Result Value   CO2 21 (*)    Anion gap 16 (*)    All other components within normal limits  URINE DRUG SCREEN - Abnormal; Notable for the following components:   Benzodiazepines POSITIVE (*)    All other components within normal limits  PROTIME-INR  APTT  CBC  DIFFERENTIAL  ETHANOL    EKG: EKG Interpretation Date/Time:  Tuesday May 03 2024 16:41:59 EST Ventricular Rate:  80 PR Interval:  146 QRS Duration:  101 QT Interval:  410 QTC Calculation: 473 R Axis:   66  Text Interpretation: Sinus rhythm Atrial premature complexes Low voltage, precordial leads No significant change since last tracing Confirmed by Dean Clarity [phone]) on  05/04/2024 12:35:39 AM  Radiology: MR BRAIN WO CONTRAST Result Date: 05/03/2024 EXAM: MRI BRAIN WITHOUT CONTRAST 05/03/2024 05:04:46 PM TECHNIQUE: Multiplanar multisequence MRI of the head/brain was performed without the administration of intravenous contrast. COMPARISON: None available. CLINICAL HISTORY: Neuro deficit, acute, stroke suspected. Acute neurological  deficit; stroke suspected. FINDINGS: BRAIN AND VENTRICLES: No acute infarct. No intracranial hemorrhage. No mass. No midline shift. No hydrocephalus. The sella is unremarkable. Normal flow voids. There is mild subcortical and periventricular T2 and FLAIR signal hyperintensity likely reflecting sequelae of chronic microvascular ischemia. ORBITS: No significant abnormality. SINUSES AND MASTOIDS: No significant abnormality. BONES AND SOFT TISSUES: Normal marrow signal. No soft tissue abnormality. There are degenerative changes of the partially evaluated upper cervical spine. IMPRESSION: 1. No acute intracranial abnormality. Electronically signed by: Prentice Spade MD 05/03/2024 05:24 PM EST RP Workstation: [id number]     Procedures   Medications Ordered in the ED  predniSONE  (DELTASONE ) tablet 60 mg (60 mg Oral Given 05/03/24 1924)  valACYclovir  (VALTREX ) tablet 1,000 mg (1,000 mg Oral Given 05/03/24 1924)                                    Medical Decision Making Amount and/or Complexity of Data Reviewed Labs: ordered. Radiology: ordered.  Risk Prescription drug management.   This patient presents to the ED for concern of facial droop, this involves an extensive number of treatment options, and is a complaint that carries with it a high risk of complications and morbidity.  The differential diagnosis includes bells palsy, cva   Co morbidities that complicate the patient evaluation  htn, anxiety, gerd, arthritis, and hld   Additional history obtained:  Additional history obtained from epic chart review External records from  outside source obtained and reviewed including husband   Lab Tests:  I Ordered, and personally interpreted labs.  The pertinent results include:  cbc nl, cmp nl, inr nl, etoh neg, uds + bzd   Imaging Studies ordered:  I ordered imaging studies including mri brain  I independently visualized and interpreted imaging which showed No acute intracranial abnormality.  I agree with the radiologist interpretation   Cardiac Monitoring:  The patient was maintained on a cardiac monitor.  I personally viewed and interpreted the cardiac monitored which showed an underlying rhythm of: nsr   Medicines ordered and prescription drug management:  I ordered medication including prednisone , valtrex   for bells  Reevaluation of the patient after these medicines showed that the patient stayed the same I have reviewed the patients home medicines and have made adjustments as needed   Test Considered:  mri  Problem List / ED Course:  Bells Palsy::  no evidence of CVA on MRI.  Pt is stable for d/c with prednisone  and valtrex .  She is to return if worse.  F/u with pcp.   Reevaluation:  After the interventions noted above, I reevaluated the patient and found that they have :improved   Social Determinants of Health:  Lives at home   Dispostion:  After consideration of the diagnostic results and the patients response to treatment, I feel that the patent would benefit from discharge with outpatient f/u.       Final diagnoses:  Bell's palsy    ED Discharge Orders          Ordered    valACYclovir  (VALTREX ) 1000 MG tablet  3 times daily        05/03/24 1901    predniSONE  (DELTASONE ) 20 MG tablet  Daily        05/03/24 1901               Dean Clarity, MD 05/04/24 0037  "

## 2024-05-31 ENCOUNTER — Ambulatory Visit: Admitting: Family
